# Patient Record
Sex: Female | Born: 1980 | Race: White | Hispanic: No | Marital: Single | Smoking: Former smoker
Health system: Southern US, Community
[De-identification: ages and names within clinical notes are randomized; demographics above are authoritative.]

## PROBLEM LIST (undated history)

## (undated) DIAGNOSIS — F419 Anxiety disorder, unspecified: Secondary | ICD-10-CM

## (undated) DIAGNOSIS — F1910 Other psychoactive substance abuse, uncomplicated: Secondary | ICD-10-CM

## (undated) DIAGNOSIS — F32A Depression, unspecified: Secondary | ICD-10-CM

## (undated) DIAGNOSIS — E119 Type 2 diabetes mellitus without complications: Secondary | ICD-10-CM

## (undated) DIAGNOSIS — F329 Major depressive disorder, single episode, unspecified: Secondary | ICD-10-CM

## (undated) DIAGNOSIS — E785 Hyperlipidemia, unspecified: Secondary | ICD-10-CM

## (undated) HISTORY — PX: TUBAL LIGATION: SHX77

---
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
## 2000-01-09 ENCOUNTER — Ambulatory Visit (HOSPITAL_COMMUNITY): Admission: RE | Admit: 2000-01-09 | Discharge: 2000-01-09 | Payer: Self-pay | Admitting: Obstetrics and Gynecology

## 2000-01-11 ENCOUNTER — Ambulatory Visit (HOSPITAL_COMMUNITY): Admission: RE | Admit: 2000-01-11 | Discharge: 2000-01-11 | Payer: Self-pay | Admitting: Obstetrics and Gynecology

## 2000-01-18 ENCOUNTER — Ambulatory Visit (HOSPITAL_COMMUNITY): Admission: RE | Admit: 2000-01-18 | Discharge: 2000-01-18 | Payer: Self-pay | Admitting: Obstetrics and Gynecology

## 2000-01-18 ENCOUNTER — Encounter: Payer: Self-pay | Admitting: Obstetrics and Gynecology

## 2000-03-27 ENCOUNTER — Inpatient Hospital Stay (HOSPITAL_COMMUNITY): Admission: AD | Admit: 2000-03-27 | Discharge: 2000-03-29 | Payer: Self-pay | Admitting: Obstetrics and Gynecology

## 2003-05-31 ENCOUNTER — Emergency Department (HOSPITAL_COMMUNITY): Admission: EM | Admit: 2003-05-31 | Discharge: 2003-05-31 | Payer: Self-pay | Admitting: Emergency Medicine

## 2003-12-16 ENCOUNTER — Other Ambulatory Visit: Admission: RE | Admit: 2003-12-16 | Discharge: 2003-12-16 | Payer: Self-pay | Admitting: Obstetrics and Gynecology

## 2004-04-19 ENCOUNTER — Ambulatory Visit (HOSPITAL_COMMUNITY): Admission: RE | Admit: 2004-04-19 | Discharge: 2004-04-19 | Payer: Self-pay | Admitting: Obstetrics and Gynecology

## 2004-07-10 ENCOUNTER — Inpatient Hospital Stay (HOSPITAL_COMMUNITY): Admission: AD | Admit: 2004-07-10 | Discharge: 2004-07-12 | Payer: Self-pay | Admitting: Obstetrics and Gynecology

## 2004-07-11 ENCOUNTER — Encounter (INDEPENDENT_AMBULATORY_CARE_PROVIDER_SITE_OTHER): Payer: Self-pay | Admitting: *Deleted

## 2004-12-30 ENCOUNTER — Emergency Department (HOSPITAL_COMMUNITY): Admission: EM | Admit: 2004-12-30 | Discharge: 2004-12-30 | Payer: Self-pay | Admitting: Emergency Medicine

## 2005-05-30 ENCOUNTER — Emergency Department (HOSPITAL_COMMUNITY): Admission: EM | Admit: 2005-05-30 | Discharge: 2005-05-31 | Payer: Self-pay | Admitting: Emergency Medicine

## 2005-06-14 ENCOUNTER — Encounter: Payer: Self-pay | Admitting: Emergency Medicine

## 2006-12-08 ENCOUNTER — Emergency Department (HOSPITAL_COMMUNITY): Admission: EM | Admit: 2006-12-08 | Discharge: 2006-12-08 | Payer: Self-pay | Admitting: Family Medicine

## 2007-03-01 ENCOUNTER — Ambulatory Visit (HOSPITAL_COMMUNITY): Admission: RE | Admit: 2007-03-01 | Discharge: 2007-03-01 | Payer: Self-pay | Admitting: Family Medicine

## 2008-11-13 ENCOUNTER — Emergency Department (HOSPITAL_COMMUNITY): Admission: EM | Admit: 2008-11-13 | Discharge: 2008-11-13 | Payer: Self-pay | Admitting: Emergency Medicine

## 2009-10-13 ENCOUNTER — Emergency Department (HOSPITAL_COMMUNITY): Admission: EM | Admit: 2009-10-13 | Discharge: 2009-10-13 | Payer: Self-pay | Admitting: Emergency Medicine

## 2010-04-19 ENCOUNTER — Emergency Department (HOSPITAL_COMMUNITY): Payer: Self-pay

## 2010-04-19 ENCOUNTER — Emergency Department (HOSPITAL_COMMUNITY)
Admission: EM | Admit: 2010-04-19 | Discharge: 2010-04-19 | Disposition: A | Discharge status: Home / Self Care | Payer: Self-pay | Attending: Emergency Medicine | Admitting: Emergency Medicine

## 2010-04-19 DIAGNOSIS — S335XXA Sprain of ligaments of lumbar spine, initial encounter: Secondary | ICD-10-CM | POA: Insufficient documentation

## 2010-04-19 DIAGNOSIS — X503XXA Overexertion from repetitive movements, initial encounter: Secondary | ICD-10-CM | POA: Insufficient documentation

## 2010-04-19 LAB — URINE MICROSCOPIC-ADD ON

## 2010-04-19 LAB — URINALYSIS, ROUTINE W REFLEX MICROSCOPIC
Glucose, UA: NEGATIVE mg/dL
Hgb urine dipstick: NEGATIVE
Ketones, ur: NEGATIVE mg/dL
Protein, ur: NEGATIVE mg/dL

## 2010-04-29 LAB — DIFFERENTIAL
Eosinophils Relative: 1 % (ref 0–5)
Lymphocytes Relative: 24 % (ref 12–46)
Lymphs Abs: 2.1 10*3/uL (ref 0.7–4.0)
Monocytes Absolute: 0.7 10*3/uL (ref 0.1–1.0)

## 2010-04-29 LAB — CBC
HCT: 33.8 % — ABNORMAL LOW (ref 36.0–46.0)
MCV: 86.7 fL (ref 78.0–100.0)
Platelets: 194 10*3/uL (ref 150–400)
RBC: 3.9 MIL/uL (ref 3.87–5.11)
WBC: 8.6 10*3/uL (ref 4.0–10.5)

## 2010-04-29 LAB — URINALYSIS, ROUTINE W REFLEX MICROSCOPIC
Bilirubin Urine: NEGATIVE
Nitrite: NEGATIVE
Specific Gravity, Urine: 1.015 (ref 1.005–1.030)
pH: 7.5 (ref 5.0–8.0)

## 2010-04-29 LAB — PREGNANCY, URINE: Preg Test, Ur: NEGATIVE

## 2010-04-29 LAB — BASIC METABOLIC PANEL
Chloride: 112 mEq/L (ref 96–112)
GFR calc Af Amer: >60 mL/min (ref >60)
Potassium: 3.1 mEq/L — ABNORMAL LOW (ref 3.5–5.1)

## 2010-04-29 LAB — URINE MICROSCOPIC-ADD ON

## 2010-07-01 NOTE — Discharge Summary (Signed)
NAME:  [name], [name]                 ACCOUNT NO.:  [id number]   MEDICAL RECORD NO.:  [id number]          PATIENT TYPE:  INP   LOCATION:  9107                          FACILITY:  [facility]   PHYSICIAN:  Huel Cote, M.D. DATE OF BIRTH:  July 16, 1980   DATE OF ADMISSION:  07/10/2004  DATE OF DISCHARGE:  07/12/2004                                 DISCHARGE SUMMARY   DISCHARGE DIAGNOSES:  1.  Term pregnancy at 56 6/7 weeks delivered.  2.  Status post vaginal birth after cesarean section.  3.  Status post postpartum tubal ligation.   DISCHARGE FOLLOWUP:  The patient's follow-up in the office in approximately  two weeks for an incision check.   HOSPITAL COURSE:  The patient is a 30 year old, G3, P2-0-0-1 who was  admitted at 12 6/7 weeks for delivery after spontaneous rupture of  membranes. The patient had no contractions on arrival and was noted to be  grossly ruptured. Prenatal care had been complicated by a history of a  previous C-section with subsequent VBAC delivery and Rh negative status for  which she received RhoGAM. She also desired a tubal ligation. Prenatal labs  were as follows:  B negative, antibody negative, RPR nonreactive, rubella  immune, hepatitis B surface antigen negative, HIV nonreactive, GC negative,  chlamydia negative, group B strep negative. One hour Glucola 108.   PAST OBSTETRICAL HISTORY:  In 04-Jul-1997, she had an 8 pound 15 ounce infant boy.  This child subsequently died in a car accident and in 04-Jul-2000 she had a vaginal  delivery after a C-section of that prior delivery of an 8 pound 7 ounce  infant.   PAST GYN HISTORY:  None.   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   PAST MEDICAL HISTORY:  None.   ALLERGIES:  None.   MEDICATIONS:  She takes Ambien occasionally.   PHYSICAL EXAMINATION:  On admission she was afebrile with stable vital  signs. Fetal heart rate was reactive. Cervix was 72 and a -2 station with  grossly ruptured membranes noted. She was placed on Pitocin and progressed  slowly throughout the day, eventually reaching complete dilation. She then  pushed well with a normal spontaneous vaginal delivery of a vigorous female  infant over a first-degree laceration. Apgar's were 9 and 9, weight was 8  pounds 5 ounces. Nuchal cord x1 was reduced over the head,  placenta  delivered spontaneously, first-degree laceration was repaired with 2-0  Vicryl for hemostasis. The patient requested a tubal ligation which was  scheduled  for the following day after risks and benefits were discussed. Her  hemoglobin went from 9.8 to 8.8. She underwent her postpartum tubal ligation  without difficulty and on postpartum day #2 was felt stable for discharge  home. She was discharged on Vicodin and Motrin for pain and will follow up  in the office in two weeks as stated.       KR/MEDQ  D:  08/03/2004  T:  08/03/2004  Job:  [id number]

## 2010-07-01 NOTE — Discharge Summary (Signed)
[facility]  Patient:    PALMYRA, ROGACKI                      MRN: [id number] Adm. Date:  [id number] Disc. Date: [id number] Attending:  Malon Kindle                           Discharge Summary  DISCHARGE DIAGNOSES:          1. Intrauterine pregnancy at 39+ weeks,                                  delivered.                               2. Vaginal birth after cesarean section.                               3. Status post low forceps vaginal delivery.  DISCHARGE MEDICATIONS:        1. Motrin 600 mg p.o. every six hours p.r.n.                               2. Percocet one to two tablets p.o. every four                                  hours p.r.n.  DISCHARGE FOLLOWUP:           The patient is to follow up in approximately six weeks for her routine postpartum exam.  HOSPITAL COURSE:              The patient is a 30 year old G2, P1 who was admitted for induction of labor for a planned VBAC delivery. The patients pregnancy was complicated by a previous C-section and that child was later killed in a car accident. Also, she had a placenta previa early in pregnancy; however, followup ultrasound revealed that this had resolved. Otherwise, the patient was Rh negative and received RhoGAM, and had no other significant complications.  PRENATAL LABORATORIES:        B negative, antibody negative. RPR nonreactive. Rubella immune. Hepatitis B surface antigen negative. HIV negative. GC negative. Chlamydia negative. GBS negative. Her one-hour GTT was abnormal at 160; however, a three-hour was within normal limits.  PAST MEDICAL HISTORY:         None.  PAST SURGICAL HISTORY:        The patient had laser surgery on her vocal cores in 1987 and 1989 and also C-section in 1999.  PAST OBSTETRICAL HISTORY:     In 1999, she had a low transverse cesarean section for arrest of descent of an 8-pound 15-ounce infant.  ALLERGIES:                    No known drug  allergies.  MEDICATIONS AT TIME OF ADMISSION:                    Prenatal vitamins.  PHYSICAL EXAMINATION:         On admission, the patient was afebrile and vital signs were stable. Fetal heart rate was reactive.  Cervical exam was 2 cm/50 effaced and a -2 station.  DELIVERY NOTE:  She had assisted rupture of membranes with clear fluid obtained and was begun on Pitocin. The patient progressed well with Pitocin induction, and reached complete dilation later in the day. She pushed for approximately two hours and became exhausted with the vertex presenting at OA and a +2 to +3 station. The patient had a low forceps delivery and was delivered of an 8-pound 7-ounce female infant. Apgars were 9 and 9. Placenta was intact. Scar was intact. She had bilateral vaginal and labial lacerations which were repaired with 3-0 Dexon. EBL was approximately 600 cc. There were two loops of nuchal cord noted at the delivery. The patient was then admitted for routine postpartum care and did well. She remained afebrile and had normal lochia. Her pain was well controlled with Motrin and Percocet. Therefore, on postpartum day #2 she was discharged to home with follow up and medications as previously stated. DD:  03/29/00 TD:  03/29/00 Job: [id number]

## 2010-07-01 NOTE — Op Note (Signed)
NAME:  [name] [name], [name] [name]                 ACCOUNT NO.:  [id number]   MEDICAL RECORD NO.:  [id number]          PATIENT TYPE:  INP   LOCATION:  9107                          FACILITY:  [facility]   PHYSICIAN:  Zenaida Niece, M.D.DATE OF BIRTH:  Dec 13, 1980   DATE OF PROCEDURE:  07/11/2004  DATE OF DISCHARGE:                                 OPERATIVE REPORT   PREOPERATIVE DIAGNOSES:  1.  Postpartum.  2.  Desires surgical sterility.   POSTOPERATIVE DIAGNOSES:  1.  Postpartum.  2.  Desires surgical sterility.   PROCEDURE:  Bilateral partial salpingectomy.   SURGEON:  Zenaida Niece, M.D.   ANESTHESIA:  General endotracheal tube.   ESTIMATED BLOOD LOSS:  Less than 50 mL.   FINDINGS:  The patient had normal anatomy.   COMPLICATIONS:  None.   SPECIMENS:  Portions of bilateral fallopian tubes.   PROCEDURE IN DETAIL:  The patient was taken to the operating room and placed  in the dorsal supine position.  General anesthesia was induced and the  abdomen was prepped and draped in the usual sterile fashion.  Infraumbilical skin was then infiltrated with 0.25% Marcaine and a 3 cm  horizontal incision was made.  This was carried down to the fascia, which was an old elevated and incised  with the scissors.  Peritoneal cavity was then entered bluntly.  She was  placed in Trendelenburg.  Both fallopian tubes were identified and traced to  their fimbriated ends.  A segment of the middle of each tube was grasped  with a Babcock clamp.  A knuckle of tube was ligated on each side with 0  plain gut suture.  This knuckle of tube was then removed sharply and sent as  a specimen.  On both sides, both tubal ostia were identified and the stumps  were hemostatic.  The fascia was closed with running  0 Vicryl.  The skin was closed with running subcuticular suture of 4-0  Vicryl, followed by a Band-Aid.  The patient tolerated the procedure well.  She was extubated in the operating room taken to recovery room  in stable  condition.  Counts were correct x2, and she received no antibiotics.      TDM/MEDQ  D:  07/11/2004  T:  07/11/2004  Job:  [id number]

## 2010-10-18 ENCOUNTER — Encounter: Payer: Self-pay | Admitting: Emergency Medicine

## 2010-10-18 ENCOUNTER — Emergency Department (HOSPITAL_COMMUNITY): Payer: Self-pay

## 2010-10-18 ENCOUNTER — Emergency Department (HOSPITAL_COMMUNITY)
Admission: EM | Admit: 2010-10-18 | Discharge: 2010-10-18 | Disposition: A | Discharge status: Home / Self Care | Payer: Self-pay | Attending: Emergency Medicine | Admitting: Emergency Medicine

## 2010-10-18 DIAGNOSIS — R000 Tachycardia, unspecified: Secondary | ICD-10-CM | POA: Insufficient documentation

## 2010-10-18 DIAGNOSIS — N739 Female pelvic inflammatory disease, unspecified: Secondary | ICD-10-CM | POA: Insufficient documentation

## 2010-10-18 DIAGNOSIS — Z87891 Personal history of nicotine dependence: Secondary | ICD-10-CM | POA: Insufficient documentation

## 2010-10-18 DIAGNOSIS — N72 Inflammatory disease of cervix uteri: Secondary | ICD-10-CM | POA: Insufficient documentation

## 2010-10-18 LAB — URINE MICROSCOPIC-ADD ON

## 2010-10-18 LAB — URINALYSIS, ROUTINE W REFLEX MICROSCOPIC
Bilirubin Urine: NEGATIVE
Glucose, UA: NEGATIVE mg/dL
Hgb urine dipstick: NEGATIVE
Ketones, ur: 40 mg/dL — AB
Nitrite: NEGATIVE
Protein, ur: 30 mg/dL — AB
Specific Gravity, Urine: >1.03 — ABNORMAL HIGH (ref 1.005–1.030)
Urobilinogen, UA: 1 mg/dL (ref 0.0–1.0)
pH: 6 (ref 5.0–8.0)

## 2010-10-18 LAB — CBC
HCT: 35.2 % — ABNORMAL LOW (ref 36.0–46.0)
Hemoglobin: 11.7 g/dL — ABNORMAL LOW (ref 12.0–15.0)
MCH: 27.3 pg (ref 26.0–34.0)
MCHC: 33.2 g/dL (ref 30.0–36.0)
MCV: 82.2 fL (ref 78.0–100.0)
Platelets: 195 10*3/uL (ref 150–400)
RBC: 4.28 MIL/uL (ref 3.87–5.11)
RDW: 14.3 % (ref 11.5–15.5)
WBC: 14.3 10*3/uL — ABNORMAL HIGH (ref 4.0–10.5)

## 2010-10-18 LAB — BASIC METABOLIC PANEL
BUN: 8 mg/dL (ref 6–23)
CO2: 23 mEq/L (ref 19–32)
Calcium: 9.3 mg/dL (ref 8.4–10.5)
Chloride: 98 mEq/L (ref 96–112)
Creatinine, Ser: 0.74 mg/dL (ref 0.50–1.10)
GFR calc Af Amer: >60 mL/min (ref >60)
GFR calc non Af Amer: >60 mL/min (ref >60)
Glucose, Bld: 98 mg/dL (ref 70–99)
Potassium: 3.4 mEq/L — ABNORMAL LOW (ref 3.5–5.1)
Sodium: 131 mEq/L — ABNORMAL LOW (ref 135–145)

## 2010-10-18 LAB — WET PREP, GENITAL
Trich, Wet Prep: NONE SEEN
Yeast Wet Prep HPF POC: NONE SEEN

## 2010-10-18 LAB — HCG, QUANTITATIVE, PREGNANCY: hCG, Beta Chain, Quant, S: 1 m[IU]/mL (ref <5)

## 2010-10-18 MED ORDER — OXYCODONE-ACETAMINOPHEN 5-325 MG PO TABS: 1.0000 | ORAL_TABLET | ORAL | Status: AC | PRN

## 2010-10-18 MED ORDER — SODIUM CHLORIDE 0.9 % IV SOLN
Freq: Once | INTRAVENOUS | Status: AC
  Administered 2010-10-18: 12:00:00 via INTRAVENOUS

## 2010-10-18 MED ORDER — HYDROMORPHONE HCL 1 MG/ML IJ SOLN
1.0000 mg | Freq: Once | INTRAMUSCULAR | Status: AC
  Administered 2010-10-18: 1 mg via INTRAVENOUS
  Filled 2010-10-18: qty 1

## 2010-10-18 MED ORDER — KETOROLAC TROMETHAMINE 30 MG/ML IJ SOLN
15.0000 mg | Freq: Once | INTRAMUSCULAR | Status: AC
  Administered 2010-10-18: 30 mg via INTRAVENOUS

## 2010-10-18 MED ORDER — KETOROLAC TROMETHAMINE 30 MG/ML IJ SOLN
INTRAMUSCULAR | Status: AC
  Filled 2010-10-18: qty 1

## 2010-10-18 MED ORDER — ONDANSETRON HCL 4 MG/2ML IJ SOLN
4.0000 mg | Freq: Once | INTRAMUSCULAR | Status: AC
  Administered 2010-10-18: 4 mg via INTRAVENOUS
  Filled 2010-10-18: qty 2

## 2010-10-18 MED ORDER — DOXYCYCLINE HYCLATE 100 MG PO CAPS: 100.0000 mg | ORAL_CAPSULE | Freq: Two times a day (BID) | ORAL | Status: AC

## 2010-10-18 MED ORDER — CEFTRIAXONE SODIUM 250 MG IJ SOLR
250.0000 mg | Freq: Once | INTRAMUSCULAR | Status: AC
  Administered 2010-10-18: 250 mg via INTRAMUSCULAR
  Filled 2010-10-18: qty 250

## 2010-10-18 MED ORDER — LIDOCAINE HCL (PF) 1 % IJ SOLN
INTRAMUSCULAR | Status: AC
  Filled 2010-10-18: qty 5

## 2010-10-18 NOTE — ED Notes (Signed)
Assisted with pelvic exam--cultures obtained and sent to lab.

## 2010-10-18 NOTE — ED Provider Notes (Addendum)
History   HPI Comments: 30yf with R lower back and R flank pain. Onset yesterday while standing at work. Constant with occasional sharper pain. No radiation. Subjective fever. Hx of kidney stones and says feels like previous. Decreased urination. No vaginal bleeding or discharge. Hx of tubal ligation. Nausea. Vomiting x1 yesterday. No sick contacts. No diarrhea. Denies trauma.   The history is provided by the patient. No language interpreter was used.    CSN: [id number] Arrival date & time: 10/18/2010 10:47 AM  ED Triage Vitals  Enc Vitals Group     BP 10/18/10 1044 132/76 mmHg     Pulse Rate 10/18/10 1044 111      Resp 10/18/10 1044 18      Temp 10/18/10 1044 100 F (37.8 C)     Temp src 10/18/10 1044 Oral     SpO2 10/18/10 1044 100 %     Weight 10/18/10 1044 168 lb (76.204 kg)     Height 10/18/10 1044 5\' 6"  (1.676 m)     Head Cir --      Peak Flow --      Pain Score 10/18/10 1044 Ten     Pain Loc --      Pain Edu? --      Excl. in GC? --      Chief Complaint  Patient presents with  . Abdominal Pain  . Back Pain  . Headache  . Sore Throat   HPI Comments: 30yf with R lower back and R flank pain. Onset yesterday while standing at work. Constant with occasional sharper pain. No radiation. Subjective fever. Hx of kidney stones and says feels like previous. Decreased urination. No vaginal bleeding or discharge. Hx of tubal ligation. Nausea. Vomiting x1 yesterday. No sick contacts. No diarrhea. Denies trauma.   The history is provided by the patient. No language interpreter was used.    History reviewed. No pertinent past medical history.  History reviewed. No pertinent past surgical history.  History reviewed. No pertinent family history.  History  Substance Use Topics  . Smoking status: Former Games developer  . Smokeless tobacco: Not on file  . Alcohol Use: No    OB History    Grav Para Term Preterm Abortions TAB SAB Ect Mult Living                  Review of Systems    Constitutional: Positive for fever.  Cardiovascular: Negative for chest pain and palpitations.  Gastrointestinal: Negative for abdominal distention.  Genitourinary: Positive for decreased urine volume and difficulty urinating. Negative for dysuria, vaginal bleeding and vaginal discharge.  Musculoskeletal: Positive for back pain.  Neurological: Negative for dizziness and syncope.    Physical Exam  BP 132/76  Pulse 111  Temp(Src) 100 F (37.8 C) (Oral)  Resp 18  Ht 5\' 6"  (1.676 m)  Wt 168 lb (76.204 kg)  BMI 27.12 kg/m2  SpO2 100%  LMP 09/14/2010  Physical Exam  Constitutional: She appears well-developed and well-nourished.  Neck: Trachea normal. No hepatojugular reflux present.  Cardiovascular: Regular rhythm.  Tachycardia present.        Tahcyhcardic. No murmur. Regular rhythm.  Pulmonary/Chest: No respiratory distress. She has no decreased breath sounds. She has no wheezes. She has no rhonchi. She has no rales.  Abdominal: Soft. She exhibits no distension. There is tenderness. There is guarding. There is no rebound.       Diffuse R sided abdominal tenderness and R CVA tenderness. Voluntary guarding. No rebound.  Genitourinary: Pelvic exam was performed with patient supine. Cervix exhibits discharge and friability. Right adnexum displays no mass, no tenderness and no fullness. Left adnexum displays no mass, no tenderness and no fullness.  Skin: Skin is warm. She is diaphoretic.  Psychiatric:       Anxious     ED Course  Procedures  Results for orders placed during the hospital encounter of 10/18/10  CBC      Component Value Range   WBC 14.3 (*) 4.0 - 10.5 (K/uL)   RBC 4.28  3.87 - 5.11 (MIL/uL)   Hemoglobin 11.7 (*) 12.0 - 15.0 (g/dL)   HCT 16.1 (*) 09.6 - 46.0 (%)   MCV 82.2  78.0 - 100.0 (fL)   MCH 27.3  26.0 - 34.0 (pg)   MCHC 33.2  30.0 - 36.0 (g/dL)   RDW 04.5  40.9 - 81.1 (%)   Platelets 195  150 - 400 (K/uL)  BASIC METABOLIC PANEL      Component Value  Range   Sodium 131 (*) 135 - 145 (mEq/L)   Potassium 3.4 (*) 3.5 - 5.1 (mEq/L)   Chloride 98  96 - 112 (mEq/L)   CO2 23  19 - 32 (mEq/L)   Glucose, Bld 98  70 - 99 (mg/dL)   BUN 8  6 - 23 (mg/dL)   Creatinine, Ser 9.14  0.50 - 1.10 (mg/dL)   Calcium 9.3  8.4 - 78.2 (mg/dL)   GFR calc non Af Amer >60  >60 (mL/min)   GFR calc Af Amer >60  >60 (mL/min)  HCG, QUANTITATIVE, PREGNANCY      Component Value Range   hCG, Beta Chain, Quant, S 1  <5 (mIU/mL)  URINALYSIS, ROUTINE W REFLEX MICROSCOPIC      Component Value Range   Color, Urine YELLOW  YELLOW    Appearance CLOUDY (*) CLEAR    Specific Gravity, Urine >1.030 (*) 1.005 - 1.030    pH 6.0  5.0 - 8.0    Glucose, UA NEGATIVE  NEGATIVE (mg/dL)   Hgb urine dipstick NEGATIVE  NEGATIVE    Bilirubin Urine NEGATIVE  NEGATIVE    Ketones, ur 40 (*) NEGATIVE (mg/dL)   Protein, ur 30 (*) NEGATIVE (mg/dL)   Urobilinogen, UA 1.0  0.0 - 1.0 (mg/dL)   Nitrite NEGATIVE  NEGATIVE    Leukocytes, UA LARGE (*) NEGATIVE   URINE MICROSCOPIC-ADD ON      Component Value Range   Squamous Epithelial / LPF MANY (*) RARE    WBC, UA 7-10  <3 (WBC/hpf)   RBC / HPF 0-2  <3 (RBC/hpf)   Bacteria, UA FEW (*) RARE    Ct Abdomen Pelvis Wo Contrast  10/18/2010  *RADIOLOGY REPORT*  Clinical Data: Abdominal pain, back pain.  History of kidney stones.  CT ABDOMEN AND PELVIS WITHOUT CONTRAST  Technique:  Multidetector CT imaging of the abdomen and pelvis was performed following the standard protocol without intravenous contrast.  Comparison: 10/13/2009  Findings: Lung bases are clear.  No effusions.  Heart is normal size.  Liver, gallbladder, spleen, pancreas, adrenals and kidneys unremarkable.  No renal or ureteral stones. The no hydronephrosis. Urinary bladder is unremarkable.  Uterus and adnexa have a grossly unremarkable unenhanced appearance.  Appendix is visualized and is normal.  Trace free fluid in the pelvis, likely physiologic.  Large and small bowel decompressed  and grossly unremarkable.  No acute bony abnormality. No acute bony abnormality.  There is a bulging disc at L4-5 and to a lesser  extent L5-S1.  IMPRESSION: No renal or ureteral stones.  No hydronephrosis.  No acute findings.  Original Report Authenticated By: Cyndie Chime, M.D.      Discussed CT and other results with pt. No large adnexal mass on CT which may suggest possible ovarian torsion.  Recommended pelvic exam to eval for PID/TOA although no suggestive inflammatory changes on CT. Pt agreeable.  MDM 30yF with abdominal pain. Pelvic exam showed cervicitis and a large amount of discharge. CT unremarkable. Pain improved and no new complaints prior to DC. Tx for PID and discussed signs/symptoms to return for immediate re-evaluation.      Raeford Razor, MD 10/22/10 2011  Raeford Razor, MD 10/24/10 [phone]

## 2010-10-18 NOTE — ED Notes (Signed)
C/o bilateral lower abdominal pain radiating into low back on both sides---onset yesterday with gradual worsening and similar to previous episodes with kidney stones.  Also c/o burning with urination--Rates pain 10 on 1-10 scale

## 2010-10-18 NOTE — ED Notes (Signed)
Pt c/o lower abd pain, back pain, headache, sore throat and states she is having a hard time trying to urinate.

## 2010-10-19 LAB — GC/CHLAMYDIA PROBE AMP, GENITAL
Chlamydia, DNA Probe: NEGATIVE
GC Probe Amp, Genital: NEGATIVE

## 2010-11-04 ENCOUNTER — Emergency Department (HOSPITAL_COMMUNITY)
Admission: EM | Admit: 2010-11-04 | Discharge: 2010-11-04 | Discharge status: Against Medical Advice | Payer: Self-pay | Attending: Emergency Medicine | Admitting: Emergency Medicine

## 2010-11-04 ENCOUNTER — Ambulatory Visit (HOSPITAL_COMMUNITY)
Admission: RE | Admit: 2010-11-04 | Discharge: 2010-11-04 | Disposition: A | Discharge status: Home / Self Care | Payer: Self-pay | Source: Ambulatory Visit | Attending: Psychiatry | Admitting: Psychiatry

## 2010-11-04 DIAGNOSIS — F1920 Other psychoactive substance dependence, uncomplicated: Secondary | ICD-10-CM | POA: Insufficient documentation

## 2010-11-23 LAB — POCT URINALYSIS DIP (DEVICE)
Glucose, UA: NEGATIVE
Nitrite: POSITIVE — AB
Operator id: 239701
Protein, ur: >=300 — AB
Specific Gravity, Urine: 1.025
Urobilinogen, UA: 0.2

## 2010-11-23 LAB — POCT PREGNANCY, URINE: Preg Test, Ur: NEGATIVE

## 2012-05-22 ENCOUNTER — Telehealth: Payer: Self-pay | Admitting: Nurse Practitioner

## 2012-05-23 NOTE — Telephone Encounter (Signed)
Still no call- will wait for pt to call back

## 2012-05-25 ENCOUNTER — Telehealth: Payer: Self-pay | Admitting: Physician Assistant

## 2012-05-25 MED ORDER — CLONAZEPAM 0.5 MG PO TABS: 0.5000 mg | ORAL_TABLET | Freq: Every evening | ORAL | Status: DC | PRN

## 2012-05-25 NOTE — Telephone Encounter (Addendum)
Dr. Modesto Charon authorized refill of clonazepam but not lorazepam stating that it was a duplication.  She needs to establish care with another PCP since Helene Kelp, PA-C is no longer working here.  Phoned into [facility] pharmacy and patient is aware.

## 2012-05-25 NOTE — Addendum Note (Signed)
Addended by: Gwenith Daily on: 05/25/2012 09:46 AM   Modules accepted: Orders

## 2012-05-25 NOTE — Telephone Encounter (Signed)
Refilled clonazepam once only, not the ativan. Patient needs to see a provider.

## 2012-05-25 NOTE — Addendum Note (Signed)
Addended by: Gwenith Daily on: 05/25/2012 10:00 AM   Modules accepted: Orders

## 2012-05-25 NOTE — Telephone Encounter (Signed)
Requesting refill on clonazepam and lorazepam.  Pharmacy has sent over multiple faxed requests last week.

## 2012-05-30 NOTE — Telephone Encounter (Signed)
LEFT A DETAILED MESSAGE THAT IF STILL NEEDS APPT TO PLEASE CALL

## 2012-06-01 ENCOUNTER — Emergency Department (HOSPITAL_COMMUNITY)
Admission: EM | Admit: 2012-06-01 | Discharge: 2012-06-01 | Disposition: A | Discharge status: Home / Self Care | Payer: Medicaid Other | Attending: Emergency Medicine | Admitting: Emergency Medicine

## 2012-06-01 ENCOUNTER — Encounter (HOSPITAL_COMMUNITY): Payer: Self-pay | Admitting: Emergency Medicine

## 2012-06-01 DIAGNOSIS — Z9851 Tubal ligation status: Secondary | ICD-10-CM | POA: Insufficient documentation

## 2012-06-01 DIAGNOSIS — F17200 Nicotine dependence, unspecified, uncomplicated: Secondary | ICD-10-CM | POA: Insufficient documentation

## 2012-06-01 DIAGNOSIS — E785 Hyperlipidemia, unspecified: Secondary | ICD-10-CM | POA: Insufficient documentation

## 2012-06-01 DIAGNOSIS — F3289 Other specified depressive episodes: Secondary | ICD-10-CM | POA: Insufficient documentation

## 2012-06-01 DIAGNOSIS — Z79899 Other long term (current) drug therapy: Secondary | ICD-10-CM | POA: Insufficient documentation

## 2012-06-01 DIAGNOSIS — R112 Nausea with vomiting, unspecified: Secondary | ICD-10-CM | POA: Insufficient documentation

## 2012-06-01 DIAGNOSIS — R42 Dizziness and giddiness: Secondary | ICD-10-CM | POA: Insufficient documentation

## 2012-06-01 DIAGNOSIS — F329 Major depressive disorder, single episode, unspecified: Secondary | ICD-10-CM | POA: Insufficient documentation

## 2012-06-01 DIAGNOSIS — R630 Anorexia: Secondary | ICD-10-CM | POA: Insufficient documentation

## 2012-06-01 DIAGNOSIS — R109 Unspecified abdominal pain: Secondary | ICD-10-CM

## 2012-06-01 DIAGNOSIS — F411 Generalized anxiety disorder: Secondary | ICD-10-CM | POA: Insufficient documentation

## 2012-06-01 HISTORY — DX: Depression, unspecified: F32.A

## 2012-06-01 HISTORY — DX: Hyperlipidemia, unspecified: E78.5

## 2012-06-01 HISTORY — DX: Anxiety disorder, unspecified: F41.9

## 2012-06-01 HISTORY — DX: Major depressive disorder, single episode, unspecified: F32.9

## 2012-06-01 LAB — POCT I-STAT, CHEM 8
Creatinine, Ser: 0.7 mg/dL (ref 0.50–1.10)
Glucose, Bld: 86 mg/dL (ref 70–99)
HCT: 37 % (ref 36.0–46.0)
Hemoglobin: 12.6 g/dL (ref 12.0–15.0)
Potassium: 4.1 mEq/L (ref 3.5–5.1)
TCO2: 27 mmol/L (ref 0–100)

## 2012-06-01 MED ORDER — SODIUM CHLORIDE 0.9 % IV SOLN
1000.0000 mL | Freq: Once | INTRAVENOUS | Status: AC
  Administered 2012-06-01: 1000 mL via INTRAVENOUS

## 2012-06-01 MED ORDER — ONDANSETRON HCL 4 MG/2ML IJ SOLN
4.0000 mg | Freq: Once | INTRAMUSCULAR | Status: AC
  Administered 2012-06-01: 4 mg via INTRAVENOUS
  Filled 2012-06-01: qty 2

## 2012-06-01 MED ORDER — ONDANSETRON HCL 4 MG PO TABS: 4.0000 mg | ORAL_TABLET | Freq: Four times a day (QID) | ORAL | Status: DC

## 2012-06-01 MED ORDER — METOCLOPRAMIDE HCL 5 MG/ML IJ SOLN
10.0000 mg | Freq: Once | INTRAMUSCULAR | Status: AC
  Administered 2012-06-01: 10 mg via INTRAVENOUS
  Filled 2012-06-01: qty 2

## 2012-06-01 MED ORDER — SODIUM CHLORIDE 0.9 % IV SOLN: 1000.0000 mL | INTRAVENOUS | Status: DC

## 2012-06-01 MED ORDER — PROMETHAZINE HCL 25 MG/ML IJ SOLN
12.5000 mg | Freq: Once | INTRAMUSCULAR | Status: AC
  Administered 2012-06-01: 21:00:00 via INTRAVENOUS
  Filled 2012-06-01: qty 1

## 2012-06-01 MED ORDER — DIPHENHYDRAMINE HCL 50 MG/ML IJ SOLN
25.0000 mg | Freq: Once | INTRAMUSCULAR | Status: AC
  Administered 2012-06-01: 25 mg via INTRAVENOUS
  Filled 2012-06-01: qty 1

## 2012-06-01 MED ORDER — KETOROLAC TROMETHAMINE 30 MG/ML IJ SOLN
30.0000 mg | Freq: Once | INTRAMUSCULAR | Status: AC
  Administered 2012-06-01: 30 mg via INTRAVENOUS
  Filled 2012-06-01: qty 1

## 2012-06-01 MED ORDER — PROMETHAZINE HCL 25 MG RE SUPP: 25.0000 mg | Freq: Four times a day (QID) | RECTAL | Status: DC | PRN

## 2012-06-01 NOTE — ED Provider Notes (Signed)
History  This chart was scribed for Ward Givens, MD by Erskine Emery, ED Scribe. This patient was seen in room APA15/APA15 and the patient's care was started at 19:20.   CSN: [id number]  Arrival date & time 06/01/12  1903   First MD Initiated Contact with Patient 06/01/12 1920      Chief Complaint  Patient presents with  . Abdominal Pain  . Emesis    (Consider location/radiation/quality/duration/timing/severity/associated sxs/prior treatment) The history is provided by the patient. No language interpreter was used.  [name] [name] is a 32 y.o. female who presents to the Emergency Department complaining of sudden onset emesis (with streaks of blood the past two episodes) and lower abdominal cramping since 1pm this afternoon, just after lunch (which she didn't have an appetite to each much of). She has vomited about 10 times but denies diarrhea.  Pt reports some associated nausea, dizziness, and light headedness, but denies any fever or known sick contacts. Pt states she feels like she is going to pass out when she stands up. No one else is sick at home, she has no known food exposure to make her ill.   Pt was recently diagnosed with H. pylori and was taking antibiotics for it, but she has been off of them for about 2 months.   Pt also presents with a c-section scar that she has had for 15 years that she claims sometimes gets red and irritated. Pt's husband claims there are "holes" at the site of her scar that get infected.  PCP is Dr. Helene Kelp at [facility].  Past Medical History  Diagnosis Date  . Hyperlipidemia   . Depression   . Anxiety     Past Surgical History  Procedure Laterality Date  . Cesarean section    . Tubal ligation      History reviewed. No pertinent family history.  History  Substance Use Topics  . Smoking status: Current Every Day Smoker  . Smokeless tobacco: Not on file  . Alcohol Use: No   Pt reports she smokes about 1  pack/month, not every day. Lives with spouse  OB History   Grav Para Term Preterm Abortions TAB SAB Ect Mult Living                  Review of Systems  Constitutional: Positive for appetite change. Negative for fever.  Gastrointestinal: Positive for nausea, vomiting and abdominal pain. Negative for diarrhea.  Neurological: Positive for dizziness and light-headedness.  All other systems reviewed and are negative.    Allergies  Codeine and Vicodin  Home Medications   Current Outpatient Rx  Name  Route  Sig  Dispense  Refill  . clonazePAM (KLONOPIN) 0.5 MG tablet   Oral   Take 1 tablet (0.5 mg total) by mouth at bedtime as needed for anxiety.   30 tablet   0   . ibuprofen (ADVIL,MOTRIN) 200 MG tablet   Oral   Take 600-800 mg by mouth every 6 (six) hours as needed. Pain          . LORazepam (ATIVAN) 0.5 MG tablet   Oral   Take 0.5 mg by mouth every 12 (twelve) hours. Take every 12-24 hours         . omeprazole (PRILOSEC) 20 MG capsule   Oral   Take 20 mg by mouth 2 (two) times daily.         . sertraline (ZOLOFT) 50 MG tablet   Oral  Take 50 mg by mouth daily.           Triage Vitals: BP 127/77  Pulse 67  Temp(Src) 97 F (36.1 C) (Oral)  Resp 18  Ht 5\' 6"  (1.676 m)  Wt 186 lb (84.369 kg)  BMI 30.04 kg/m2  SpO2 100%  LMP 05/01/2012  Vital signs normal    Physical Exam  Nursing note and vitals reviewed. Constitutional: She is oriented to person, place, and time. She appears well-developed and well-nourished.  Non-toxic appearance. She does not appear ill. No distress.  HENT:  Head: Normocephalic and atraumatic.  Right Ear: External ear normal.  Left Ear: External ear normal.  Nose: Nose normal. No mucosal edema or rhinorrhea.  Mouth/Throat: Oropharynx is clear and moist and mucous membranes are normal. No dental abscesses or edematous.  Mucus membranes moist.  Eyes: Conjunctivae and EOM are normal. Pupils are equal, round, and reactive to  light.  Neck: Normal range of motion and full passive range of motion without pain. Neck supple.  Cardiovascular: Normal rate, regular rhythm and normal heart sounds.  Exam reveals no gallop and no friction rub.   No murmur heard. Pulmonary/Chest: Effort normal and breath sounds normal. No respiratory distress. She has no wheezes. She has no rhonchi. She has no rales. She exhibits no tenderness and no crepitus.  Abdominal: Soft. Normal appearance and bowel sounds are normal. She exhibits no distension. There is tenderness. There is no rebound and no guarding.  Tender diffusely in lower abdomen.  Musculoskeletal: Normal range of motion. She exhibits no edema and no tenderness.  Moves all extremities well.   Neurological: She is alert and oriented to person, place, and time. She has normal strength. No cranial nerve deficit.  Skin: Skin is warm, dry and intact. No rash noted. No erythema. No pallor.  Well-healed bikini c-section scar, with no "holes"  Psychiatric: Her speech is normal and behavior is normal. Her mood appears not anxious.  Seems anxious.    ED Course  Procedures (including critical care time)  Medications  0.9 %  sodium chloride infusion (0 mLs Intravenous Stopped 06/01/12 2057)    Followed by  0.9 %  sodium chloride infusion (0 mLs Intravenous Stopped 06/01/12 2223)    Followed by  0.9 %  sodium chloride infusion (not administered)  metoCLOPramide (REGLAN) injection 10 mg (10 mg Intravenous Given 06/01/12 2014)  ondansetron (ZOFRAN) injection 4 mg (4 mg Intravenous Given 06/01/12 2017)  diphenhydrAMINE (BENADRYL) injection 25 mg (25 mg Intravenous Given 06/01/12 2019)  promethazine (PHENERGAN) injection 12.5 mg ( Intravenous Given 06/01/12 2054)  ketorolac (TORADOL) 30 MG/ML injection 30 mg (30 mg Intravenous Given 06/01/12 2229)    DIAGNOSTIC STUDIES: Oxygen Saturation is 100% on room air, normal by my interpretation.    COORDINATION OF CARE: 19:42--I evaluated the  patient and we discussed a treatment plan including IV fluids and nausea medication to which the pt agreed. We discussed yeast infection treatment for the area around her c-section scar.  Pt feeling better and ready to go home.   Results for orders placed during the hospital encounter of 06/01/12  POCT I-STAT, CHEM 8      Result Value Range   Sodium 139  135 - 145 mEq/L   Potassium 4.1  3.5 - 5.1 mEq/L   Chloride 105  96 - 112 mEq/L   BUN 15  6 - 23 mg/dL   Creatinine, Ser 1.61  0.50 - 1.10 mg/dL   Glucose, Bld 86  70 -  99 mg/dL   Calcium, Ion 1.61  1.12 - 1.23 mmol/L   TCO2 27  0 - 100 mmol/L   Hemoglobin 12.6  12.0 - 15.0 g/dL   HCT 09.6  04.5 - 40.9 %   Laboratory interpretation all normal    1. Nausea and vomiting in adult   2. Abdominal cramping      New Prescriptions   ONDANSETRON (ZOFRAN) 4 MG TABLET    Take 1 tablet (4 mg total) by mouth every 6 (six) hours.   PROMETHAZINE (PHENERGAN) 25 MG SUPPOSITORY    Place 1 suppository (25 mg total) rectally every 6 (six) hours as needed for nausea.    Plan discharge  Devoria Albe, MD, FACEP    MDM   I personally performed the services described in this documentation, which was scribed in my presence. The recorded information has been reviewed and considered.  Devoria Albe, MD, Armando Gang    Ward Givens, MD 06/01/12 [phone]

## 2012-06-01 NOTE — ED Notes (Signed)
Patient complaining of nausea, emesis, dizziness, and abdominal cramps that started today.

## 2012-06-26 ENCOUNTER — Other Ambulatory Visit: Payer: Self-pay

## 2012-06-26 MED ORDER — CLONAZEPAM 0.5 MG PO TABS: 0.5000 mg | ORAL_TABLET | Freq: Every evening | ORAL | Status: DC | PRN

## 2012-06-26 NOTE — Telephone Encounter (Signed)
Please call in rx for clonazepam 

## 2012-06-26 NOTE — Telephone Encounter (Signed)
Med at [facility]

## 2012-06-26 NOTE — Telephone Encounter (Signed)
Last filled 05/25/12   Last seen 04/18/12   Phone in and have nurse call patient to let them know

## 2012-07-17 ENCOUNTER — Encounter: Payer: Self-pay | Admitting: Family Medicine

## 2012-07-17 ENCOUNTER — Ambulatory Visit (INDEPENDENT_AMBULATORY_CARE_PROVIDER_SITE_OTHER): Payer: Medicaid Other | Admitting: Family Medicine

## 2012-07-17 VITALS — BP 125/84 | HR 73 | Temp 97.2°F | Ht 67.0 in | Wt 181.0 lb

## 2012-07-17 DIAGNOSIS — F410 Panic disorder [episodic paroxysmal anxiety] without agoraphobia: Secondary | ICD-10-CM

## 2012-07-17 DIAGNOSIS — R768 Other specified abnormal immunological findings in serum: Secondary | ICD-10-CM

## 2012-07-17 DIAGNOSIS — F411 Generalized anxiety disorder: Secondary | ICD-10-CM

## 2012-07-17 DIAGNOSIS — R894 Abnormal immunological findings in specimens from other organs, systems and tissues: Secondary | ICD-10-CM

## 2012-07-17 DIAGNOSIS — E785 Hyperlipidemia, unspecified: Secondary | ICD-10-CM

## 2012-07-17 MED ORDER — SERTRALINE HCL 100 MG PO TABS: 100.0000 mg | ORAL_TABLET | Freq: Every day | ORAL | Status: DC

## 2012-07-17 MED ORDER — CLONAZEPAM 0.5 MG PO TABS: 0.5000 mg | ORAL_TABLET | Freq: Every evening | ORAL | Status: DC | PRN

## 2012-07-17 NOTE — Progress Notes (Signed)
Patient ID: [name], female   DOB: [birth year], 32 y.o.   MRN: [id number] SUBJECTIVE: Chief Complaint  Patient presents with  . Follow-up    WAS acm PT  . Panic Attack    ANXIETY   HPI: Boyfriend and father of her children is in jal for DUI. She also was in trouble for being in the vehicle. So te stresses of being a single parent , living at home with her parents and meeting with a parole officer on a regular basis makes her anxious and panic. H/o H pylori infection and teh blood work is always positive.  PMH/PSH: reviewed/updated in Epic  SH/FH: reviewed/updated in Epic  Allergies: reviewed/updated in Epic  Medications: reviewed/updated in Epic  Immunizations: reviewed/updated in Epic  ROS: As above in the HPI. All other systems are stable or negative.  OBJECTIVE: APPEARANCE:  Patient in no acute distress.The patient appeared well nourished and normally developed. Acyanotic. Waist: VITAL SIGNS:BP 125/84  Pulse 73  Temp(Src) 97.2 F (36.2 C) (Oral)  Ht 5\' 7"  (1.702 m)  Wt 181 lb (82.101 kg)  BMI 28.34 kg/m2  LMP 06/26/2012 WF, overweight.  SKIN: warm and  Dry without overt rashes, tattoos and scars  HEAD and Neck: without JVD, Head and scalp: normal Eyes:No scleral icterus. Fundi normal, eye movements normal. Ears: Auricle normal, canal normal, Tympanic membranes normal, insufflation normal. Nose: normal Throat: normal Neck & thyroid: normal  CHEST & LUNGS: Chest wall: normal Lungs: Clear  CVS: Reveals the PMI to be normally located. Regular rhythm, First and Second Heart sounds are normal,  absence of murmurs, rubs or gallops. Peripheral vasculature: Radial pulses: normal Dorsal pedis pulses: normal Posterior pulses: normal  ABDOMEN:  Appearance: normal Benign, no organomegaly, no masses, no Abdominal Aortic enlargement. No Guarding , no rebound. No Bruits. Bowel sounds: normal  RECTAL: N/A GU: N/A  EXTREMETIES: nonedematous. Both Femoral  and Pedal pulses are normal.  MUSCULOSKELETAL:  Spine: normal Joints: intact  NEUROLOGIC: oriented to time,place and person; nonfocal.  Psychiatry: stressed but composed and coping.  ASSESSMENT: Anxiety state, unspecified - Plan: sertraline (ZOLOFT) 100 MG tablet, clonazePAM (KLONOPIN) 0.5 MG tablet  Panic - Plan: sertraline (ZOLOFT) 100 MG tablet, clonazePAM (KLONOPIN) 0.5 MG tablet  HLD (hyperlipidemia) - Plan: NMR Lipoprofile with Lipids  Helicobacter pylori ab+ - Plan: Helicobacter pylori antigen det, stool  PLAN: Explained to patient that her blood work will continue to remain positive and that the test to do was the stools. Orders Placed This Encounter  Procedures  . NMR Lipoprofile with Lipids  . Helicobacter pylori antigen det, stool   Meds ordered this encounter  Medications  . sertraline (ZOLOFT) 100 MG tablet    Sig: Take 1 tablet (100 mg total) by mouth daily.    Dispense:  30 tablet    Refill:  3  . clonazePAM (KLONOPIN) 0.5 MG tablet    Sig: Take 1 tablet (0.5 mg total) by mouth at bedtime as needed for anxiety.    Dispense:  20 tablet    Refill:  0   Counseled patient on the need to self refer to [facility] for counselling and psychiatric care.  Limited benzodiazepine given to use prn for panic.  Return in about 3 months (around 10/17/2012) for Recheck medical problems.  Alan Drummer P. [place] Charon, M.D.

## 2012-07-18 LAB — NMR LIPOPROFILE WITH LIPIDS
Cholesterol, Total: 202 mg/dL — ABNORMAL HIGH (ref <200)
HDL Particle Number: 39.4 umol/L (ref >=30.5)
HDL Size: 9.4 nm (ref >=9.2)
HDL-C: 66 mg/dL (ref >=40)
LDL (calc): 116 mg/dL — ABNORMAL HIGH (ref <100)
LDL Particle Number: 1258 nmol/L — ABNORMAL HIGH (ref <1000)
LDL Size: 21.1 nm (ref >20.5)
LP-IR Score: 43 (ref <=45)
Large HDL-P: 8.9 umol/L (ref >=4.8)
Large VLDL-P: 5.5 nmol/L — ABNORMAL HIGH (ref <=2.7)
Small LDL Particle Number: 493 nmol/L (ref <=527)
Triglycerides: 99 mg/dL (ref <150)
VLDL Size: 47.8 nm — ABNORMAL HIGH (ref <=46.6)

## 2012-07-22 ENCOUNTER — Telehealth: Payer: Self-pay | Admitting: Family Medicine

## 2012-07-24 ENCOUNTER — Telehealth: Payer: Self-pay | Admitting: Family Medicine

## 2012-07-24 DIAGNOSIS — B002 Herpesviral gingivostomatitis and pharyngotonsillitis: Secondary | ICD-10-CM | POA: Insufficient documentation

## 2012-07-24 MED ORDER — VALACYCLOVIR HCL 1 G PO TABS: 2000.0000 mg | ORAL_TABLET | Freq: Two times a day (BID) | ORAL | Status: DC

## 2012-07-24 NOTE — Telephone Encounter (Signed)
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 Rx ordered in Epic. Sahirah Rudell P. [place] Charon, M.D.

## 2012-08-01 NOTE — Progress Notes (Signed)
Quick Note:  Lab result close to goal. No change in Medications for now. No Change in plans and follow up. Recommend that she embark on more aggressive diet and Exercise to reduce the cholesterol a bit more to get to goal of <100 LDLc. Thanks. Karl Knarr P. [place] Charon, M.D.  ______

## 2012-09-01 ENCOUNTER — Encounter (HOSPITAL_COMMUNITY): Payer: Self-pay | Admitting: *Deleted

## 2012-09-01 ENCOUNTER — Emergency Department (HOSPITAL_COMMUNITY)
Admission: EM | Admit: 2012-09-01 | Discharge: 2012-09-01 | Disposition: A | Discharge status: Home / Self Care | Payer: Medicaid Other | Attending: Emergency Medicine | Admitting: Emergency Medicine

## 2012-09-01 ENCOUNTER — Emergency Department (HOSPITAL_COMMUNITY): Payer: Medicaid Other

## 2012-09-01 DIAGNOSIS — Z79899 Other long term (current) drug therapy: Secondary | ICD-10-CM | POA: Insufficient documentation

## 2012-09-01 DIAGNOSIS — E785 Hyperlipidemia, unspecified: Secondary | ICD-10-CM | POA: Insufficient documentation

## 2012-09-01 DIAGNOSIS — W19XXXA Unspecified fall, initial encounter: Secondary | ICD-10-CM

## 2012-09-01 DIAGNOSIS — F329 Major depressive disorder, single episode, unspecified: Secondary | ICD-10-CM | POA: Insufficient documentation

## 2012-09-01 DIAGNOSIS — F17200 Nicotine dependence, unspecified, uncomplicated: Secondary | ICD-10-CM | POA: Insufficient documentation

## 2012-09-01 DIAGNOSIS — M549 Dorsalgia, unspecified: Secondary | ICD-10-CM

## 2012-09-01 DIAGNOSIS — Y9289 Other specified places as the place of occurrence of the external cause: Secondary | ICD-10-CM | POA: Insufficient documentation

## 2012-09-01 DIAGNOSIS — F3289 Other specified depressive episodes: Secondary | ICD-10-CM | POA: Insufficient documentation

## 2012-09-01 DIAGNOSIS — F411 Generalized anxiety disorder: Secondary | ICD-10-CM | POA: Insufficient documentation

## 2012-09-01 DIAGNOSIS — R209 Unspecified disturbances of skin sensation: Secondary | ICD-10-CM | POA: Insufficient documentation

## 2012-09-01 DIAGNOSIS — IMO0002 Reserved for concepts with insufficient information to code with codable children: Secondary | ICD-10-CM | POA: Insufficient documentation

## 2012-09-01 DIAGNOSIS — Y9389 Activity, other specified: Secondary | ICD-10-CM | POA: Insufficient documentation

## 2012-09-01 DIAGNOSIS — W1809XA Striking against other object with subsequent fall, initial encounter: Secondary | ICD-10-CM | POA: Insufficient documentation

## 2012-09-01 DIAGNOSIS — W010XXA Fall on same level from slipping, tripping and stumbling without subsequent striking against object, initial encounter: Secondary | ICD-10-CM | POA: Insufficient documentation

## 2012-09-01 MED ORDER — OXYCODONE-ACETAMINOPHEN 5-325 MG PO TABS: 1.0000 | ORAL_TABLET | ORAL | Status: DC | PRN

## 2012-09-01 MED ORDER — OXYCODONE-ACETAMINOPHEN 5-325 MG PO TABS
1.0000 | ORAL_TABLET | Freq: Once | ORAL | Status: AC
  Administered 2012-09-01: 1 via ORAL
  Filled 2012-09-01: qty 1

## 2012-09-01 NOTE — ED Notes (Signed)
Pt states fell today on cement. Lower back hurts.

## 2012-09-01 NOTE — ED Provider Notes (Signed)
History  This chart was scribed for Sharilyn Sites, PA-C working with Candyce Churn, MD by Greggory Stallion, ED scribe. This patient was seen in room TR08C/TR08C and the patient's care was started at 8:52 PM.  CSN: [id number] Arrival date & time 09/01/12  2037   Chief Complaint  Patient presents with  . Fall   The history is provided by the patient. No language interpreter was used.    HPI Comments: [name] [name] is a 32 y.o. female who presents to the Emergency Department complaining of sudden onset, constant lower back pain that started earlier today when she fell on cement. She states she tripped over her own feet and fell backwards, hitting her low back on the cement step.  No head trauma or LOC.  Some intermittent radiation of pain into her right posterior thigh.Denies any numbness or paresthesias of LE.  No loss of bowel or bladder function. Pt denies any other associated symptoms. She states she has taken ibuprofen with no relief. Ambulatory in triage.  Past Medical History  Diagnosis Date  . Hyperlipidemia   . Depression   . Anxiety    Past Surgical History  Procedure Laterality Date  . Cesarean section    . Tubal ligation     Family History  Problem Relation Age of Onset  . Hypertension Father   . Cancer Paternal Aunt     BREAST  . COPD Paternal Grandfather   . Hyperlipidemia Paternal Grandfather   . Hypertension Paternal Grandfather    History  Substance Use Topics  . Smoking status: Current Some Day Smoker -- 0.25 packs/day    Types: Cigarettes  . Smokeless tobacco: Not on file  . Alcohol Use: No   OB History   Grav Para Term Preterm Abortions TAB SAB Ect Mult Living                 Review of Systems  Musculoskeletal: Positive for back pain.  Neurological: Positive for numbness.  All other systems reviewed and are negative.    Allergies  Review of patient's allergies indicates no known allergies.  Home Medications   Current Outpatient Rx  Name   Route  Sig  Dispense  Refill  . clonazePAM (KLONOPIN) 0.5 MG tablet   Oral   Take 1 tablet (0.5 mg total) by mouth at bedtime as needed for anxiety.   20 tablet   0   . omeprazole (PRILOSEC) 20 MG capsule   Oral   Take 20 mg by mouth 2 (two) times daily.         . pravastatin (PRAVACHOL) 20 MG tablet   Oral   Take 20 mg by mouth daily.         . sertraline (ZOLOFT) 100 MG tablet   Oral   Take 1 tablet (100 mg total) by mouth daily.   30 tablet   3   . valACYclovir (VALTREX) 1000 MG tablet   Oral   Take 2 tablets (2,000 mg total) by mouth 2 (two) times daily. 2 doses only   30 tablet   0    BP 148/99  Pulse 74  Temp(Src) 98.2 F (36.8 C) (Oral)  Resp 16  SpO2 99%  Physical Exam  Nursing note and vitals reviewed. Constitutional: She is oriented to person, place, and time. She appears well-developed and well-nourished.  HENT:  Head: Normocephalic and atraumatic.  Eyes: Conjunctivae and EOM are normal. Pupils are equal, round, and reactive to light.  Neck: Normal range  of motion. Neck supple.  Cardiovascular: Normal rate, regular rhythm and normal heart sounds.   Pulmonary/Chest: Effort normal and breath sounds normal.  Musculoskeletal: Normal range of motion.       Lumbar back: She exhibits tenderness, bony tenderness and pain. She exhibits no edema and no deformity.       Back:  Diffuse tenderness to palpation of lumbar spine, several abrasions noted without signs of infection or FB, strong distal pulse, sensation intact, normal gait  Neurological: She is alert and oriented to person, place, and time.  Skin: Skin is warm and dry.  Psychiatric: She has a normal mood and affect.    ED Course  Procedures (including critical care time)  DIAGNOSTIC STUDIES: Oxygen Saturation is 99% on RA, normal by my interpretation.    COORDINATION OF CARE: 9:01 PM-Discussed treatment plan which includes pain medication and xray with pt at bedside and pt agreed to plan.    Labs Reviewed - No data to display Dg Lumbar Spine Complete  09/01/2012   *RADIOLOGY REPORT*  Clinical Data: Larey Seat on cement steps yesterday.  Mid low back pain. Numbness and tingling down the posterior legs.  LUMBAR SPINE - COMPLETE 4+ VIEW  Comparison: CT, 10/18/2010  Findings: No fracture or spondylolisthesis.  There is moderate loss of disc height at L4-L5 and L5-S1.  The remaining disc spaces well preserved.  The facet joints well preserved.  The surrounding soft tissues are unremarkable.  IMPRESSION: No fracture or acute finding.   Original Report Authenticated By: Amie Portland, M.D.   1. Fall, initial encounter   2. Back pain     MDM   X-ray negative for acute fracture or disc herniation. No concern for cauda equina. Rx Percocet. Followup with primary care physician if symptoms not improving in the next few days. Discussed plan with patient, she agreed. Return precautions advised.  I personally performed the services described in this documentation, which was scribed in my presence. The recorded information has been reviewed and is accurate.   Garlon Hatchet, PA-C 09/01/12 2210  Garlon Hatchet, PA-C 09/01/12 2214

## 2012-09-04 NOTE — ED Provider Notes (Signed)
Medical screening examination/treatment/procedure(s) were performed by non-physician practitioner and as supervising physician I was immediately available for consultation/collaboration.  Candyce Churn, MD 09/04/12 [phone]

## 2012-10-02 ENCOUNTER — Other Ambulatory Visit: Payer: Self-pay | Admitting: Anesthesiology

## 2012-10-02 DIAGNOSIS — M5416 Radiculopathy, lumbar region: Secondary | ICD-10-CM

## 2012-10-03 ENCOUNTER — Other Ambulatory Visit: Payer: Medicaid Other

## 2012-10-09 ENCOUNTER — Ambulatory Visit
Admission: RE | Admit: 2012-10-09 | Discharge: 2012-10-09 | Disposition: A | Discharge status: Home / Self Care | Payer: Medicaid Other | Source: Ambulatory Visit | Attending: Anesthesiology | Admitting: Anesthesiology

## 2012-10-09 DIAGNOSIS — M5416 Radiculopathy, lumbar region: Secondary | ICD-10-CM

## 2012-10-27 ENCOUNTER — Emergency Department (HOSPITAL_COMMUNITY): Payer: Medicaid Other

## 2012-10-27 ENCOUNTER — Encounter (HOSPITAL_COMMUNITY): Payer: Self-pay | Admitting: *Deleted

## 2012-10-27 ENCOUNTER — Emergency Department (HOSPITAL_COMMUNITY)
Admission: EM | Admit: 2012-10-27 | Discharge: 2012-10-27 | Disposition: A | Discharge status: Home / Self Care | Payer: Medicaid Other | Attending: Emergency Medicine | Admitting: Emergency Medicine

## 2012-10-27 DIAGNOSIS — N760 Acute vaginitis: Secondary | ICD-10-CM | POA: Insufficient documentation

## 2012-10-27 DIAGNOSIS — Z862 Personal history of diseases of the blood and blood-forming organs and certain disorders involving the immune mechanism: Secondary | ICD-10-CM | POA: Insufficient documentation

## 2012-10-27 DIAGNOSIS — F411 Generalized anxiety disorder: Secondary | ICD-10-CM | POA: Insufficient documentation

## 2012-10-27 DIAGNOSIS — Z79899 Other long term (current) drug therapy: Secondary | ICD-10-CM | POA: Insufficient documentation

## 2012-10-27 DIAGNOSIS — Z3202 Encounter for pregnancy test, result negative: Secondary | ICD-10-CM | POA: Insufficient documentation

## 2012-10-27 DIAGNOSIS — F17200 Nicotine dependence, unspecified, uncomplicated: Secondary | ICD-10-CM | POA: Insufficient documentation

## 2012-10-27 DIAGNOSIS — F3289 Other specified depressive episodes: Secondary | ICD-10-CM | POA: Insufficient documentation

## 2012-10-27 DIAGNOSIS — A499 Bacterial infection, unspecified: Secondary | ICD-10-CM | POA: Insufficient documentation

## 2012-10-27 DIAGNOSIS — N949 Unspecified condition associated with female genital organs and menstrual cycle: Secondary | ICD-10-CM | POA: Insufficient documentation

## 2012-10-27 DIAGNOSIS — F329 Major depressive disorder, single episode, unspecified: Secondary | ICD-10-CM | POA: Insufficient documentation

## 2012-10-27 DIAGNOSIS — N83209 Unspecified ovarian cyst, unspecified side: Secondary | ICD-10-CM

## 2012-10-27 DIAGNOSIS — B9689 Other specified bacterial agents as the cause of diseases classified elsewhere: Secondary | ICD-10-CM | POA: Insufficient documentation

## 2012-10-27 DIAGNOSIS — R102 Pelvic and perineal pain: Secondary | ICD-10-CM

## 2012-10-27 DIAGNOSIS — Z8639 Personal history of other endocrine, nutritional and metabolic disease: Secondary | ICD-10-CM | POA: Insufficient documentation

## 2012-10-27 LAB — CBC WITH DIFFERENTIAL/PLATELET
Basophils Absolute: 0 10*3/uL (ref 0.0–0.1)
Basophils Relative: 0 % (ref 0–1)
Eosinophils Relative: 1 % (ref 0–5)
HCT: 35 % — ABNORMAL LOW (ref 36.0–46.0)
Hemoglobin: 11.7 g/dL — ABNORMAL LOW (ref 12.0–15.0)
MCH: 29 pg (ref 26.0–34.0)
MCHC: 33.4 g/dL (ref 30.0–36.0)
MCV: 86.8 fL (ref 78.0–100.0)
Monocytes Absolute: 0.9 10*3/uL (ref 0.1–1.0)
Monocytes Relative: 10 % (ref 3–12)
RDW: 15 % (ref 11.5–15.5)

## 2012-10-27 LAB — COMPREHENSIVE METABOLIC PANEL
AST: 16 U/L (ref 0–37)
Albumin: 3.3 g/dL — ABNORMAL LOW (ref 3.5–5.2)
BUN: 16 mg/dL (ref 6–23)
Calcium: 9.6 mg/dL (ref 8.4–10.5)
Creatinine, Ser: 0.92 mg/dL (ref 0.50–1.10)
GFR calc non Af Amer: 82 mL/min — ABNORMAL LOW (ref >90)
Total Bilirubin: 0.1 mg/dL — ABNORMAL LOW (ref 0.3–1.2)

## 2012-10-27 LAB — LIPASE, BLOOD: Lipase: 50 U/L (ref 11–59)

## 2012-10-27 LAB — URINALYSIS, ROUTINE W REFLEX MICROSCOPIC
Bilirubin Urine: NEGATIVE
Glucose, UA: NEGATIVE mg/dL
Ketones, ur: NEGATIVE mg/dL
Leukocytes, UA: NEGATIVE
Protein, ur: NEGATIVE mg/dL
pH: 6 (ref 5.0–8.0)

## 2012-10-27 LAB — URINE MICROSCOPIC-ADD ON

## 2012-10-27 LAB — WET PREP, GENITAL: Yeast Wet Prep HPF POC: NONE SEEN

## 2012-10-27 LAB — HIV ANTIBODY (ROUTINE TESTING W REFLEX): HIV: NONREACTIVE

## 2012-10-27 LAB — RPR: RPR Ser Ql: NONREACTIVE

## 2012-10-27 MED ORDER — KETOROLAC TROMETHAMINE 30 MG/ML IJ SOLN
30.0000 mg | Freq: Once | INTRAMUSCULAR | Status: AC
  Administered 2012-10-27: 30 mg via INTRAVENOUS
  Filled 2012-10-27: qty 1

## 2012-10-27 MED ORDER — POTASSIUM CHLORIDE CRYS ER 20 MEQ PO TBCR
40.0000 meq | EXTENDED_RELEASE_TABLET | Freq: Once | ORAL | Status: AC
  Administered 2012-10-27: 40 meq via ORAL
  Filled 2012-10-27: qty 2

## 2012-10-27 MED ORDER — MORPHINE SULFATE 4 MG/ML IJ SOLN
4.0000 mg | INTRAMUSCULAR | Status: AC | PRN
  Administered 2012-10-27 (×2): 4 mg via INTRAVENOUS
  Filled 2012-10-27 (×2): qty 1

## 2012-10-27 MED ORDER — MORPHINE SULFATE 4 MG/ML IJ SOLN
4.0000 mg | Freq: Once | INTRAMUSCULAR | Status: AC
  Administered 2012-10-27: 4 mg via INTRAVENOUS
  Filled 2012-10-27: qty 1

## 2012-10-27 MED ORDER — FUROSEMIDE 40 MG PO TABS
40.0000 mg | ORAL_TABLET | Freq: Once | ORAL | Status: AC
  Administered 2012-10-27: 40 mg via ORAL
  Filled 2012-10-27: qty 1

## 2012-10-27 MED ORDER — OXYCODONE-ACETAMINOPHEN 5-325 MG PO TABS: 1.0000 | ORAL_TABLET | ORAL | Status: DC | PRN

## 2012-10-27 MED ORDER — METRONIDAZOLE 500 MG PO TABS: 500.0000 mg | ORAL_TABLET | Freq: Two times a day (BID) | ORAL | Status: DC

## 2012-10-27 NOTE — ED Notes (Signed)
Returned from [place].  Pain at 10/10.  Medicated with 4mg  MSO4 VI.  Patient relates her menstrual cycles have been erratic for several months, lasting up to 14 days.

## 2012-10-27 NOTE — ED Notes (Signed)
Pt to department via EMS.  Reporting lower abdominal pain starting today.  Nausea, no vomiting.

## 2012-10-27 NOTE — ED Notes (Signed)
Pt resting quietly in room.  Pt awaiting for ultrasound.

## 2012-10-27 NOTE — ED Provider Notes (Signed)
CSN: [id number]     Arrival date & time 10/27/12  0129 History   First MD Initiated Contact with Patient 10/27/12 [phone]     Chief Complaint  Patient presents with  . Abdominal Pain   (Consider location/radiation/quality/duration/timing/severity/associated sxs/prior Treatment) Patient is a 32 y.o. female presenting with abdominal pain. The history is provided by the patient.  Abdominal Pain She had sudden onset of suprapubic pain at about noon. The pain waxes and wanes between 10/10 and 7/10. Nothing seems to make it better nothing seems to make it worse although she felt slightly better if she crawled up in a fetal position. There has been no radiation of pain. She denies nausea, vomiting, diarrhea. She denies vaginal discharge. She denies urinary urgency, frequency, tenesmus, dysuria. Last menses was about 3 weeks ago but she states that recently he menses have been heavier than normal. She has not taken anything for pain. Also, she noted swelling in her hands yesterday to the point where her rings were uncomfortable.  Past Medical History  Diagnosis Date  . Hyperlipidemia   . Depression   . Anxiety    Past Surgical History  Procedure Laterality Date  . Cesarean section    . Tubal ligation     Family History  Problem Relation Age of Onset  . Hypertension Father   . Cancer Paternal Aunt     BREAST  . COPD Paternal Grandfather   . Hyperlipidemia Paternal Grandfather   . Hypertension Paternal Grandfather    History  Substance Use Topics  . Smoking status: Current Some Day Smoker -- 0.25 packs/day    Types: Cigarettes  . Smokeless tobacco: Not on file  . Alcohol Use: No   OB History   Grav Para Term Preterm Abortions TAB SAB Ect Mult Living                 Review of Systems  Gastrointestinal: Positive for abdominal pain.  All other systems reviewed and are negative.    Allergies  Review of patient's allergies indicates no known allergies.  Home Medications   Current  Outpatient Rx  Name  Route  Sig  Dispense  Refill  . clonazePAM (KLONOPIN) 0.5 MG tablet   Oral   Take 0.5 mg by mouth 3 (three) times daily as needed for anxiety.         Marland Kitchen omeprazole (PRILOSEC) 20 MG capsule   Oral   Take 20 mg by mouth 2 (two) times daily.         Marland Kitchen oxyCODONE-acetaminophen (PERCOCET/ROXICET) 5-325 MG per tablet   Oral   Take 1 tablet by mouth every 4 (four) hours as needed for pain.   10 tablet   0   . sertraline (ZOLOFT) 100 MG tablet   Oral   Take 1 tablet (100 mg total) by mouth daily.   30 tablet   3    BP 127/78  Pulse 86  Temp(Src) 98.7 F (37.1 C) (Oral)  Resp 18  Ht 5\' 6"  (1.676 m)  Wt 186 lb (84.369 kg)  BMI 30.04 kg/m2  SpO2 99% Physical Exam  Nursing note and vitals reviewed.  32 year old female, resting comfortably and in no acute distress. Vital signs are normal. Oxygen saturation is 99%, which is normal. Head is normocephalic and atraumatic. PERRLA, EOMI. Oropharynx is clear. Neck is nontender and supple without adenopathy or JVD. Back is nontender and there is no CVA tenderness. Lungs are clear without rales, wheezes, or rhonchi. Chest is  nontender. Heart has regular rate and rhythm without murmur. Abdomen is soft, flat, with moderate tenderness in the suprapubic area in the midline. No other abdominal tenderness is identified. There is no rebound or guarding. There are no masses or hepatosplenomegaly and peristalsis is normoactive. Pelvic: Normal external female genitalia, cervix is normal, no discharge seen. Fundus is top normal in size. There is marked tenderness to palpation of the uterine fundus as well as cervix. There no adnexal masses or tenderness. Extremities have trace edema, full range of motion is present. Skin is warm and dry without rash. Neurologic: Mental status is normal, cranial nerves are intact, there are no motor or sensory deficits.  ED Course  Procedures (including critical care time) Labs Review Results  for orders placed during the hospital encounter of 10/27/12  WET PREP, GENITAL      Result Value Range   Yeast Wet Prep HPF POC NONE SEEN  NONE SEEN   Trich, Wet Prep NONE SEEN  NONE SEEN   Clue Cells Wet Prep HPF POC MODERATE (*) NONE SEEN   WBC, Wet Prep HPF POC FEW (*) NONE SEEN  URINALYSIS, ROUTINE W REFLEX MICROSCOPIC      Result Value Range   Color, Urine YELLOW  YELLOW   APPearance CLEAR  CLEAR   Specific Gravity, Urine >1.030 (*) 1.005 - 1.030   pH 6.0  5.0 - 8.0   Glucose, UA NEGATIVE  NEGATIVE mg/dL   Hgb urine dipstick SMALL (*) NEGATIVE   Bilirubin Urine NEGATIVE  NEGATIVE   Ketones, ur NEGATIVE  NEGATIVE mg/dL   Protein, ur NEGATIVE  NEGATIVE mg/dL   Urobilinogen, UA 0.2  0.0 - 1.0 mg/dL   Nitrite NEGATIVE  NEGATIVE   Leukocytes, UA NEGATIVE  NEGATIVE  PREGNANCY, URINE      Result Value Range   Preg Test, Ur NEGATIVE  NEGATIVE  URINE MICROSCOPIC-ADD ON      Result Value Range   Squamous Epithelial / LPF FEW (*) RARE   WBC, UA 0-2  <3 WBC/hpf   RBC / HPF 0-2  <3 RBC/hpf   Bacteria, UA RARE  RARE  CBC WITH DIFFERENTIAL      Result Value Range   WBC 8.9  4.0 - 10.5 K/uL   RBC 4.03  3.87 - 5.11 MIL/uL   Hemoglobin 11.7 (*) 12.0 - 15.0 g/dL   HCT 16.1 (*) 09.6 - 04.5 %   MCV 86.8  78.0 - 100.0 fL   MCH 29.0  26.0 - 34.0 pg   MCHC 33.4  30.0 - 36.0 g/dL   RDW 40.9  81.1 - 91.4 %   Platelets 236  150 - 400 K/uL   Neutrophils Relative % 58  43 - 77 %   Neutro Abs 5.1  1.7 - 7.7 K/uL   Lymphocytes Relative 31  12 - 46 %   Lymphs Abs 2.8  0.7 - 4.0 K/uL   Monocytes Relative 10  3 - 12 %   Monocytes Absolute 0.9  0.1 - 1.0 K/uL   Eosinophils Relative 1  0 - 5 %   Eosinophils Absolute 0.1  0.0 - 0.7 K/uL   Basophils Relative 0  0 - 1 %   Basophils Absolute 0.0  0.0 - 0.1 K/uL  COMPREHENSIVE METABOLIC PANEL      Result Value Range   Sodium 136  135 - 145 mEq/L   Potassium 3.8  3.5 - 5.1 mEq/L   Chloride 101  96 - 112 mEq/L  CO2 25  19 - 32 mEq/L   Glucose,  Bld 83  70 - 99 mg/dL   BUN 16  6 - 23 mg/dL   Creatinine, Ser 1.61  0.50 - 1.10 mg/dL   Calcium 9.6  8.4 - 09.6 mg/dL   Total Protein 7.5  6.0 - 8.3 g/dL   Albumin 3.3 (*) 3.5 - 5.2 g/dL   AST 16  0 - 37 U/L   ALT 12  0 - 35 U/L   Alkaline Phosphatase 62  39 - 117 U/L   Total Bilirubin 0.1 (*) 0.3 - 1.2 mg/dL   GFR calc non Af Amer 82 (*) >90 mL/min   GFR calc Af Amer >90  >90 mL/min  LIPASE, BLOOD      Result Value Range   Lipase 50  11 - 59 U/L    MDM   1. Pelvic pain   2. Bacterial vaginosis    Pelvic pain of uncertain cause. Workup is initiated. She is given a dose of ketorolac for pain.  She did not get adequate relief of pain with ketorolac and so was given morphine. After pelvic exam was completed, it seemed that her uterus was the source of her pain so pelvic ultrasound has been ordered. Laboratory workup is unremarkable. Case is signed out to Dr. Juleen China to check on her ultrasound report.    Dione Booze, MD 10/27/12 [phone]

## 2012-10-27 NOTE — ED Notes (Signed)
Patient with no complaints at this time. Respirations even and unlabored. Skin warm/dry. Discharge instructions reviewed with patient at this time. Patient given opportunity to voice concerns/ask questions. IV removed per policy and band-aid applied to site. Patient discharged at this time and left Emergency Department with steady gait.  

## 2012-10-27 NOTE — ED Notes (Signed)
Patient states that she is still hurting. It feels like someone is twisting her insides. Wanting to know if all her test results have come back.

## 2012-10-28 LAB — GC/CHLAMYDIA PROBE AMP: CT Probe RNA: NEGATIVE

## 2012-11-19 ENCOUNTER — Telehealth: Payer: Self-pay | Admitting: Family Medicine

## 2012-11-19 ENCOUNTER — Other Ambulatory Visit: Payer: Self-pay | Admitting: Family Medicine

## 2012-11-19 DIAGNOSIS — F410 Panic disorder [episodic paroxysmal anxiety] without agoraphobia: Secondary | ICD-10-CM

## 2012-11-19 DIAGNOSIS — F411 Generalized anxiety disorder: Secondary | ICD-10-CM

## 2012-11-19 MED ORDER — SERTRALINE HCL 100 MG PO TABS: 100.0000 mg | ORAL_TABLET | Freq: Every day | ORAL | Status: DC

## 2012-11-19 NOTE — Telephone Encounter (Signed)
Patient needs to be seen. Has exceeded time since last visit. Needs to bring all medications to next appointment. Past due. Last refill.

## 2012-11-21 ENCOUNTER — Ambulatory Visit: Payer: Medicaid Other | Admitting: Family Medicine

## 2012-11-21 NOTE — Telephone Encounter (Signed)
Left message with the patient she needs appt.

## 2012-12-23 ENCOUNTER — Ambulatory Visit (INDEPENDENT_AMBULATORY_CARE_PROVIDER_SITE_OTHER): Payer: Medicaid Other | Admitting: Family Medicine

## 2012-12-23 ENCOUNTER — Encounter: Payer: Self-pay | Admitting: Family Medicine

## 2012-12-23 VITALS — BP 109/83 | HR 86 | Temp 98.8°F | Ht 67.0 in | Wt 191.0 lb

## 2012-12-23 DIAGNOSIS — G4700 Insomnia, unspecified: Secondary | ICD-10-CM

## 2012-12-23 DIAGNOSIS — B353 Tinea pedis: Secondary | ICD-10-CM

## 2012-12-23 DIAGNOSIS — R300 Dysuria: Secondary | ICD-10-CM

## 2012-12-23 DIAGNOSIS — R635 Abnormal weight gain: Secondary | ICD-10-CM

## 2012-12-23 LAB — POCT UA - MICROSCOPIC ONLY
Casts, Ur, LPF, POC: NEGATIVE
Crystals, Ur, HPF, POC: NEGATIVE
Mucus, UA: NEGATIVE
RBC, urine, microscopic: NEGATIVE
Yeast, UA: NEGATIVE

## 2012-12-23 LAB — POCT URINALYSIS DIPSTICK
Bilirubin, UA: NEGATIVE
Blood, UA: NEGATIVE
Glucose, UA: NEGATIVE
Ketones, UA: NEGATIVE
Nitrite, UA: POSITIVE
Protein, UA: NEGATIVE
Spec Grav, UA: 1.02
Urobilinogen, UA: NEGATIVE
pH, UA: 5

## 2012-12-23 MED ORDER — CIPROFLOXACIN HCL 500 MG PO TABS: 500.0000 mg | ORAL_TABLET | Freq: Two times a day (BID) | ORAL | Status: DC

## 2012-12-23 MED ORDER — KETOCONAZOLE 2 % EX CREA: 1.0000 "application " | TOPICAL_CREAM | Freq: Every day | CUTANEOUS | Status: DC

## 2012-12-23 MED ORDER — FLUCONAZOLE 150 MG PO TABS: 150.0000 mg | ORAL_TABLET | Freq: Every day | ORAL | Status: DC

## 2012-12-23 MED ORDER — QUETIAPINE FUMARATE 100 MG PO TABS: 100.0000 mg | ORAL_TABLET | Freq: Every day | ORAL | Status: DC

## 2012-12-23 NOTE — Patient Instructions (Signed)

## 2012-12-23 NOTE — Progress Notes (Signed)
  Subjective:    Patient ID: [name] [name], female    DOB: 08/17/1980, 32 y.o.   MRN: [id number]  HPI This 32 y.o. female presents for evaluation of multiple medical problems.  She states She doesn't sleep and she stays up for days.  She has depression and she states She has been dx with agoraphobia.  She is due to see psychiatry in a few weeks.  She Has a rash on her abdomen.  She has a rash on her feet.  She dysuria and urinary frequency. She states she was feeling better when she was taking clonazepam at hs and ativan in the Daytime.   Review of Systems C/o rash, anxiety, and dysuria. No chest pain, SOB, HA, dizziness, vision change, N/V, diarrhea, constipation, myalgias, arthralgias.     Objective:   Physical Exam Vital signs noted  Well developed well nourished anxious female.  HEENT - Head atraumatic Normocephalic                Eyes - PERRLA, Conjuctiva - clear Sclera- Clear EOMI                Ears - EAC's Wnl TM's Wnl Gross Hearing WNL                Nose - Nares patent                 Throat - oropharanx wnl Respiratory - Lungs CTA bilateral Cardiac - RRR S1 and S2 without murmur GI - Abdomen soft Nontender and bowel sounds active x 4 Extremities - No edema. Neuro - Grossly intact. Skin - Dry erythematous skin on lateral right foot.  Deatra Canter FNP      Assessment & Plan:  Dysuria - Plan: POCT UA - Microscopic Only, POCT urinalysis dipstick, ciprofloxacin (CIPRO) 500 MG tablet, fluconazole (DIFLUCAN) 150 MG tablet  Insomnia - Plan: QUEtiapine (SEROQUEL) 100 MG tablet po qhs  Weight gain - Plan: POCT CBC, CMP14+EGFR, Thyroid Panel With TSH  Tinea pedis - Plan: ketoconazole (NIZORAL) 2 % cream bid x 7 days  Continue to follow up with psychiatry.  Deatra Canter FNP

## 2012-12-23 NOTE — Addendum Note (Signed)
Addended by: Orma Render F on: 12/23/2012 04:57 PM   Modules accepted: Orders

## 2012-12-24 LAB — CMP14+EGFR
ALT: 13 IU/L (ref 0–32)
AST: 18 IU/L (ref 0–40)
Albumin/Globulin Ratio: 1.4 (ref 1.1–2.5)
Albumin: 4.3 g/dL (ref 3.5–5.5)
Alkaline Phosphatase: 64 IU/L (ref 39–117)
BUN/Creatinine Ratio: 13 (ref 8–20)
BUN: 11 mg/dL (ref 6–20)
CO2: 22 mmol/L (ref 18–29)
Calcium: 9.4 mg/dL (ref 8.7–10.2)
Chloride: 101 mmol/L (ref 97–108)
Creatinine, Ser: 0.83 mg/dL (ref 0.57–1.00)
GFR calc Af Amer: 108 mL/min/{1.73_m2} (ref >59)
GFR calc non Af Amer: 94 mL/min/{1.73_m2} (ref >59)
Globulin, Total: 3 g/dL (ref 1.5–4.5)
Glucose: 89 mg/dL (ref 65–99)
Potassium: 4.5 mmol/L (ref 3.5–5.2)
Sodium: 138 mmol/L (ref 134–144)
Total Bilirubin: <0.2 mg/dL (ref 0.0–1.2)
Total Protein: 7.3 g/dL (ref 6.0–8.5)

## 2012-12-24 LAB — CBC WITH DIFFERENTIAL
Basophils Absolute: 0 10*3/uL (ref 0.0–0.2)
Basos: 0 %
Eos: 2 %
Eosinophils Absolute: 0.1 10*3/uL (ref 0.0–0.4)
HCT: 37 % (ref 34.0–46.6)
Hemoglobin: 12.5 g/dL (ref 11.1–15.9)
Immature Grans (Abs): 0 10*3/uL (ref 0.0–0.1)
Immature Granulocytes: 0 %
Lymphocytes Absolute: 2.4 10*3/uL (ref 0.7–3.1)
Lymphs: 41 %
MCH: 28.8 pg (ref 26.6–33.0)
MCHC: 33.8 g/dL (ref 31.5–35.7)
MCV: 85 fL (ref 79–97)
Monocytes Absolute: 0.7 10*3/uL (ref 0.1–0.9)
Monocytes: 12 %
Neutrophils Absolute: 2.6 10*3/uL (ref 1.4–7.0)
Neutrophils Relative %: 45 %
Platelets: 253 10*3/uL (ref 150–379)
RBC: 4.34 x10E6/uL (ref 3.77–5.28)
RDW: 14.6 % (ref 12.3–15.4)
WBC: 5.7 10*3/uL (ref 3.4–10.8)

## 2012-12-24 LAB — THYROID PANEL WITH TSH
Free Thyroxine Index: 1.8 (ref 1.2–4.9)
T3 Uptake Ratio: 28 % (ref 24–39)
T4, Total: 6.5 ug/dL (ref 4.5–12.0)
TSH: 1.21 u[IU]/mL (ref 0.450–4.500)

## 2012-12-25 ENCOUNTER — Telehealth: Payer: Self-pay | Admitting: Family Medicine

## 2012-12-25 ENCOUNTER — Other Ambulatory Visit: Payer: Self-pay | Admitting: Family Medicine

## 2012-12-25 NOTE — Telephone Encounter (Signed)
DONE

## 2013-02-17 ENCOUNTER — Encounter: Payer: Self-pay | Admitting: Family Medicine

## 2013-02-17 ENCOUNTER — Ambulatory Visit (INDEPENDENT_AMBULATORY_CARE_PROVIDER_SITE_OTHER): Payer: Medicaid Other

## 2013-02-17 ENCOUNTER — Ambulatory Visit (INDEPENDENT_AMBULATORY_CARE_PROVIDER_SITE_OTHER): Payer: Medicaid Other | Admitting: Family Medicine

## 2013-02-17 ENCOUNTER — Telehealth: Payer: Self-pay | Admitting: Family Medicine

## 2013-02-17 VITALS — BP 144/95 | HR 106 | Temp 97.6°F | Ht 67.0 in | Wt 187.0 lb

## 2013-02-17 DIAGNOSIS — F438 Other reactions to severe stress: Secondary | ICD-10-CM

## 2013-02-17 DIAGNOSIS — R0602 Shortness of breath: Secondary | ICD-10-CM | POA: Insufficient documentation

## 2013-02-17 DIAGNOSIS — F4329 Adjustment disorder with other symptoms: Secondary | ICD-10-CM | POA: Insufficient documentation

## 2013-02-17 DIAGNOSIS — F4389 Other reactions to severe stress: Secondary | ICD-10-CM

## 2013-02-17 DIAGNOSIS — F410 Panic disorder [episodic paroxysmal anxiety] without agoraphobia: Secondary | ICD-10-CM | POA: Insufficient documentation
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
## 2013-02-17 NOTE — Telephone Encounter (Signed)
Appt given for today 

## 2013-02-28 ENCOUNTER — Other Ambulatory Visit: Payer: Self-pay | Admitting: Family Medicine

## 2013-03-05 ENCOUNTER — Other Ambulatory Visit: Payer: Self-pay | Admitting: Family Medicine

## 2013-05-13 ENCOUNTER — Encounter (INDEPENDENT_AMBULATORY_CARE_PROVIDER_SITE_OTHER): Payer: Self-pay

## 2013-05-13 ENCOUNTER — Ambulatory Visit (INDEPENDENT_AMBULATORY_CARE_PROVIDER_SITE_OTHER): Payer: Medicaid Other | Admitting: Family Medicine

## 2013-05-13 ENCOUNTER — Telehealth: Payer: Self-pay | Admitting: Family Medicine

## 2013-05-13 NOTE — Telephone Encounter (Signed)
appt today at [id number]

## 2013-05-22 ENCOUNTER — Ambulatory Visit: Payer: Medicaid Other | Admitting: Family Medicine

## 2013-06-19 ENCOUNTER — Encounter: Payer: Self-pay | Admitting: *Deleted

## 2013-06-19 ENCOUNTER — Ambulatory Visit (INDEPENDENT_AMBULATORY_CARE_PROVIDER_SITE_OTHER): Payer: Medicaid Other | Admitting: Family Medicine

## 2013-06-19 VITALS — BP 119/80 | HR 62 | Temp 96.8°F | Ht 67.0 in | Wt 178.2 lb

## 2013-06-19 DIAGNOSIS — G43909 Migraine, unspecified, not intractable, without status migrainosus: Secondary | ICD-10-CM

## 2013-06-19 DIAGNOSIS — J329 Chronic sinusitis, unspecified: Secondary | ICD-10-CM

## 2013-06-19 MED ORDER — SUMATRIPTAN SUCCINATE 100 MG PO TABS: 100.0000 mg | ORAL_TABLET | ORAL | Status: DC | PRN

## 2013-06-19 MED ORDER — FLUCONAZOLE 150 MG PO TABS: 150.0000 mg | ORAL_TABLET | Freq: Once | ORAL | Status: DC

## 2013-06-19 MED ORDER — AMOXICILLIN 875 MG PO TABS: 875.0000 mg | ORAL_TABLET | Freq: Two times a day (BID) | ORAL | Status: DC

## 2013-06-19 MED ORDER — TOPIRAMATE 50 MG PO TABS: 50.0000 mg | ORAL_TABLET | Freq: Two times a day (BID) | ORAL | Status: DC

## 2013-06-19 MED ORDER — METHYLPREDNISOLONE (PAK) 4 MG PO TABS: ORAL_TABLET | ORAL | Status: DC

## 2013-06-19 MED ORDER — KETOROLAC TROMETHAMINE 30 MG/ML IJ SOLN
30.0000 mg | Freq: Once | INTRAMUSCULAR | Status: AC
  Administered 2013-06-19: 30 mg via INTRAMUSCULAR

## 2013-06-19 NOTE — Addendum Note (Signed)
Addended by: Gwenith DailyHUDY, KRISTEN N on: 06/19/2013 11:18 AM   Modules accepted: Orders

## 2013-06-19 NOTE — Progress Notes (Signed)
   Subjective:    Patient ID: [name] GrayerAmanda K [name], female    DOB: 05/06/1980, 33 y.o.   MRN: [id number]  HPI This 33 y.o. female presents for evaluation of headaches, phonophotophobia, nausea,  URI sx's, and fatigue.  She is a smoker.  She has aura with her headaches.  She has Been having these headaches every day now for several months.  She has been tx'd for Bronchitis at urgent care a few weeks ago and feels like the [place] is still present.   Review of Systems C/o headache, URI, fatigue, and phonophotophobia No chest pain, SOB, dizziness, vision change, N/V, diarrhea, constipation, dysuria, urinary urgency or frequency, myalgias, arthralgias or rash.     Objective:   Physical Exam Vital signs noted  Well developed well nourished female.  HEENT - Head atraumatic Normocephalic                Eyes - PERRLA, Conjuctiva - clear Sclera- Clear EOMI                Ears - EAC's Wnl TM's Wnl Gross Hearing WNL                 Throat - oropharanx wnl Respiratory - Lungs CTA bilateral Cardiac - RRR S1 and S2 without murmur GI - Abdomen soft Nontender and bowel sounds active x 4 Extremities - No edema. Neuro - Grossly intact.       Assessment & Plan:  Migraine headache - Plan: methylPREDNIsolone (MEDROL DOSPACK) 4 MG tablet, SUMAtriptan (IMITREX) 100 MG tablet, topiramate (TOPAMAX) 50 MG tablet, ketorolac (TORADOL) 30 MG/ML injection 30 mg  Sinusitis - Plan: amoxicillin (AMOXIL) 875 MG tablet, methylPREDNIsolone (MEDROL DOSPACK) 4 MG tablet, fluconazole (DIFLUCAN) 150 MG tablet  Tobacco abuse - encouraged patient to stop smoking  [name] CanterWilliam J [name] Weide FNP

## 2013-06-19 NOTE — Addendum Note (Signed)
Addended by: Deatra CanterXFORD, WILLIAM J on: 06/19/2013 10:17 AM   Modules accepted: Level of Service

## 2013-06-27 ENCOUNTER — Ambulatory Visit: Payer: Medicaid Other | Admitting: Family Medicine

## 2013-07-04 ENCOUNTER — Ambulatory Visit: Payer: Medicaid Other | Admitting: Family Medicine

## 2013-07-14 ENCOUNTER — Emergency Department (HOSPITAL_COMMUNITY)
Admission: EM | Admit: 2013-07-14 | Discharge: 2013-07-14 | Disposition: A | Discharge status: Home / Self Care | Payer: Medicaid Other | Attending: Emergency Medicine | Admitting: Emergency Medicine

## 2013-07-14 ENCOUNTER — Encounter (HOSPITAL_COMMUNITY): Payer: Self-pay | Admitting: Emergency Medicine

## 2013-07-14 DIAGNOSIS — E785 Hyperlipidemia, unspecified: Secondary | ICD-10-CM | POA: Insufficient documentation

## 2013-07-14 DIAGNOSIS — S39012A Strain of muscle, fascia and tendon of lower back, initial encounter: Secondary | ICD-10-CM

## 2013-07-14 DIAGNOSIS — W19XXXA Unspecified fall, initial encounter: Secondary | ICD-10-CM

## 2013-07-14 DIAGNOSIS — F3289 Other specified depressive episodes: Secondary | ICD-10-CM | POA: Insufficient documentation

## 2013-07-14 DIAGNOSIS — Z79899 Other long term (current) drug therapy: Secondary | ICD-10-CM | POA: Insufficient documentation

## 2013-07-14 DIAGNOSIS — F329 Major depressive disorder, single episode, unspecified: Secondary | ICD-10-CM | POA: Insufficient documentation

## 2013-07-14 DIAGNOSIS — Y92410 Unspecified street and highway as the place of occurrence of the external cause: Secondary | ICD-10-CM | POA: Insufficient documentation

## 2013-07-14 DIAGNOSIS — Y9389 Activity, other specified: Secondary | ICD-10-CM | POA: Insufficient documentation

## 2013-07-14 DIAGNOSIS — F411 Generalized anxiety disorder: Secondary | ICD-10-CM | POA: Insufficient documentation

## 2013-07-14 DIAGNOSIS — F17200 Nicotine dependence, unspecified, uncomplicated: Secondary | ICD-10-CM | POA: Insufficient documentation

## 2013-07-14 DIAGNOSIS — IMO0002 Reserved for concepts with insufficient information to code with codable children: Secondary | ICD-10-CM | POA: Insufficient documentation

## 2013-07-14 MED ORDER — OXYCODONE-ACETAMINOPHEN 5-325 MG PO TABS
1.0000 | ORAL_TABLET | Freq: Once | ORAL | Status: AC
  Administered 2013-07-14: 1 via ORAL
  Filled 2013-07-14: qty 1

## 2013-07-14 MED ORDER — HYDROCODONE-ACETAMINOPHEN 5-325 MG PO TABS: 1.0000 | ORAL_TABLET | ORAL | Status: DC | PRN

## 2013-07-14 MED ORDER — DIAZEPAM 5 MG PO TABS: 5.0000 mg | ORAL_TABLET | Freq: Two times a day (BID) | ORAL | Status: DC

## 2013-07-14 NOTE — Discharge Instructions (Signed)
Take Valium as needed as directed for muscle spasm. No driving or operating heavy machinery while taking valium. This medication may cause drowsiness. Take Vicodin for severe pain only. No driving or operating heavy machinery while taking vicodin. This medication may cause drowsiness. Rest, avoid heavy lifting or hard physical activity for the next few days. Apply ice intermittently 15 minutes on and off for the next 24 hours followed by heat.  Muscle Strain A muscle strain is an injury that occurs when a muscle is stretched beyond its normal length. Usually a small number of muscle fibers are torn when this happens. Muscle strain is rated in degrees. First-degree strains have the least amount of muscle fiber tearing and pain. Second-degree and third-degree strains have increasingly more tearing and pain.  Usually, recovery from muscle strain takes 1 2 weeks. Complete healing takes 5 6 weeks.  CAUSES  Muscle strain happens when a sudden, violent force placed on a muscle stretches it too far. This may occur with lifting, sports, or a fall.  RISK FACTORS Muscle strain is especially common in athletes.  SIGNS AND SYMPTOMS At the site of the muscle strain, there may be:  Pain.  Bruising.  Swelling.  Difficulty using the muscle due to pain or lack of normal function. DIAGNOSIS  Your health care provider will perform a physical exam and ask about your medical history. TREATMENT  Often, the best treatment for a muscle strain is resting, icing, and applying cold compresses to the injured area.  HOME CARE INSTRUCTIONS   Use the PRICE method of treatment to promote muscle healing during the first 2 3 days after your injury. The PRICE method involves:  Protecting the muscle from being injured again.  Restricting your activity and resting the injured body part.  Icing your injury. To do this, put ice in a plastic bag. Place a towel between your skin and the bag. Then, apply the ice and leave it  on from 15 20 minutes each hour. After the third day, switch to moist heat packs.  Apply compression to the injured area with a splint or elastic bandage. Be careful not to wrap it too tightly. This may interfere with blood circulation or increase swelling.  Elevate the injured body part above the level of your heart as often as you can.  Only take over-the-counter or prescription medicines for pain, discomfort, or fever as directed by your health care provider.  Warming up prior to exercise helps to prevent future muscle strains. SEEK MEDICAL CARE IF:   You have increasing pain or swelling in the injured area.  You have numbness, tingling, or a significant loss of strength in the injured area. MAKE SURE YOU:   Understand these instructions.  Will watch your condition.  Will get help right away if you are not doing well or get worse. Document Released: 01/30/2005 Document Revised: 11/20/2012 Document Reviewed: 08/29/2012 [facility] Patient Information 2014 [place], [place].  Lumbosacral Strain Lumbosacral strain is a strain of any of the parts that make up your lumbosacral vertebrae. Your lumbosacral vertebrae are the bones that make up the lower third of your backbone. Your lumbosacral vertebrae are held together by muscles and tough, fibrous tissue (ligaments).  CAUSES  A sudden blow to your back can cause lumbosacral strain. Also, anything that causes an excessive stretch of the muscles in the low back can cause this strain. This is typically seen when people exert themselves strenuously, fall, lift heavy objects, bend, or crouch repeatedly. RISK FACTORS  Physically demanding work.  Participation in pushing or pulling sports or sports that require sudden twist of the back (tennis, golf, baseball).  Weight lifting.  Excessive lower back curvature.  Forward-tilted pelvis.  Weak back or abdominal muscles or both.  Tight hamstrings. SIGNS AND SYMPTOMS  Lumbosacral strain may  cause pain in the area of your injury or pain that moves (radiates) down your leg.  DIAGNOSIS Your health care provider can often diagnose lumbosacral strain through a physical exam. In some cases, you may need tests such as X-ray exams.  TREATMENT  Treatment for your lower back injury depends on many factors that your clinician will have to evaluate. However, most treatment will include the use of anti-inflammatory medicines. HOME CARE INSTRUCTIONS   Avoid hard physical activities (tennis, racquetball, waterskiing) if you are not in proper physical condition for it. This may aggravate or create problems.  If you have a back problem, avoid sports requiring sudden body movements. Swimming and walking are generally safer activities.  Maintain good posture.  Maintain a healthy weight.  For acute conditions, you may put ice on the injured area.  Put ice in a plastic bag.  Place a towel between your skin and the bag.  Leave the ice on for 20 minutes, 2 3 times a day.  When the low back starts healing, stretching and strengthening exercises may be recommended. SEEK MEDICAL CARE IF:  Your back pain is getting worse.  You experience severe back pain not relieved with medicines. SEEK IMMEDIATE MEDICAL CARE IF:   You have numbness, tingling, weakness, or problems with the use of your arms or legs.  There is a change in bowel or bladder control.  You have increasing pain in any area of the body, including your belly (abdomen).  You notice shortness of breath, dizziness, or feel faint.  You feel sick to your stomach (nauseous), are throwing up (vomiting), or become sweaty.  You notice discoloration of your toes or legs, or your feet get very cold. MAKE SURE YOU:   Understand these instructions.  Will watch your condition.  Will get help right away if you are not doing well or get worse. Document Released: 11/09/2004 Document Revised: 11/20/2012 Document Reviewed:  09/18/2012 [facility] Patient Information 2014 [place], [place].
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
## 2013-07-14 NOTE — ED Notes (Signed)
Pt Fell off horse to day. Pt reports RT Lower back.

## 2013-07-15 NOTE — ED Provider Notes (Signed)
Medical screening examination/treatment/procedure(s) were performed by non-physician practitioner and as supervising physician I was immediately available for consultation/collaboration.     Joliet Mallozzi, MD 07/15/13 1527 

## 2013-10-07 ENCOUNTER — Encounter (HOSPITAL_COMMUNITY): Payer: Self-pay | Admitting: Emergency Medicine

## 2013-10-07 ENCOUNTER — Emergency Department (HOSPITAL_COMMUNITY)
Admission: EM | Admit: 2013-10-07 | Discharge: 2013-10-07 | Disposition: A | Discharge status: Home / Self Care | Payer: MEDICAID | Attending: Emergency Medicine | Admitting: Emergency Medicine

## 2013-10-07 DIAGNOSIS — F1110 Opioid abuse, uncomplicated: Secondary | ICD-10-CM | POA: Diagnosis not present

## 2013-10-07 DIAGNOSIS — F329 Major depressive disorder, single episode, unspecified: Secondary | ICD-10-CM | POA: Diagnosis not present

## 2013-10-07 DIAGNOSIS — F411 Generalized anxiety disorder: Secondary | ICD-10-CM | POA: Diagnosis not present

## 2013-10-07 DIAGNOSIS — F17200 Nicotine dependence, unspecified, uncomplicated: Secondary | ICD-10-CM | POA: Diagnosis not present

## 2013-10-07 DIAGNOSIS — F1310 Sedative, hypnotic or anxiolytic abuse, uncomplicated: Secondary | ICD-10-CM | POA: Insufficient documentation

## 2013-10-07 DIAGNOSIS — Z862 Personal history of diseases of the blood and blood-forming organs and certain disorders involving the immune mechanism: Secondary | ICD-10-CM | POA: Diagnosis not present

## 2013-10-07 DIAGNOSIS — F3289 Other specified depressive episodes: Secondary | ICD-10-CM | POA: Insufficient documentation

## 2013-10-07 DIAGNOSIS — Z79899 Other long term (current) drug therapy: Secondary | ICD-10-CM | POA: Diagnosis not present

## 2013-10-07 DIAGNOSIS — F1410 Cocaine abuse, uncomplicated: Secondary | ICD-10-CM | POA: Diagnosis not present

## 2013-10-07 DIAGNOSIS — Z8639 Personal history of other endocrine, nutritional and metabolic disease: Secondary | ICD-10-CM | POA: Diagnosis not present

## 2013-10-07 LAB — COMPREHENSIVE METABOLIC PANEL
ALK PHOS: 62 U/L (ref 39–117)
ALT: 8 U/L (ref 0–35)
AST: 12 U/L (ref 0–37)
Albumin: 3.8 g/dL (ref 3.5–5.2)
Anion gap: 11 (ref 5–15)
BILIRUBIN TOTAL: 0.3 mg/dL (ref 0.3–1.2)
BUN: 11 mg/dL (ref 6–23)
CALCIUM: 9.6 mg/dL (ref 8.4–10.5)
CHLORIDE: 102 meq/L (ref 96–112)
CO2: 26 meq/L (ref 19–32)
Creatinine, Ser: 0.79 mg/dL (ref 0.50–1.10)
GLUCOSE: 97 mg/dL (ref 70–99)
POTASSIUM: 4 meq/L (ref 3.7–5.3)
SODIUM: 139 meq/L (ref 137–147)
Total Protein: 7.6 g/dL (ref 6.0–8.3)

## 2013-10-07 LAB — CBC
HCT: 34.6 % — ABNORMAL LOW (ref 36.0–46.0)
HEMOGLOBIN: 11.7 g/dL — AB (ref 12.0–15.0)
MCH: 28.5 pg (ref 26.0–34.0)
MCHC: 33.8 g/dL (ref 30.0–36.0)
MCV: 84.2 fL (ref 78.0–100.0)
Platelets: 246 10*3/uL (ref 150–400)
RBC: 4.11 MIL/uL (ref 3.87–5.11)
RDW: 13.9 % (ref 11.5–15.5)
WBC: 10.7 10*3/uL — AB (ref 4.0–10.5)

## 2013-10-07 LAB — RAPID URINE DRUG SCREEN, HOSP PERFORMED
AMPHETAMINES: NOT DETECTED
Barbiturates: NOT DETECTED
Benzodiazepines: POSITIVE — AB
Cocaine: POSITIVE — AB
Opiates: POSITIVE — AB
TETRAHYDROCANNABINOL: NOT DETECTED

## 2013-10-07 LAB — ETHANOL: Alcohol, Ethyl (B): <11 mg/dL (ref 0–11)

## 2013-10-07 MED ORDER — ALUM & MAG HYDROXIDE-SIMETH 200-200-20 MG/5ML PO SUSP: 30.0000 mL | ORAL | Status: DC | PRN

## 2013-10-07 MED ORDER — ACETAMINOPHEN 325 MG PO TABS
650.0000 mg | ORAL_TABLET | ORAL | Status: DC | PRN
Start: 2013-10-07 — End: 2013-10-08

## 2013-10-07 MED ORDER — LORAZEPAM 1 MG PO TABS: 0.0000 mg | ORAL_TABLET | Freq: Two times a day (BID) | ORAL | Status: DC

## 2013-10-07 MED ORDER — NICOTINE 21 MG/24HR TD PT24: 21.0000 mg | MEDICATED_PATCH | Freq: Every day | TRANSDERMAL | Status: DC

## 2013-10-07 MED ORDER — ZOLPIDEM TARTRATE 5 MG PO TABS: 5.0000 mg | ORAL_TABLET | Freq: Every evening | ORAL | Status: DC | PRN

## 2013-10-07 MED ORDER — ONDANSETRON HCL 4 MG PO TABS: 4.0000 mg | ORAL_TABLET | Freq: Three times a day (TID) | ORAL | Status: DC | PRN

## 2013-10-07 MED ORDER — LORAZEPAM 1 MG PO TABS: 0.0000 mg | ORAL_TABLET | Freq: Four times a day (QID) | ORAL | Status: DC

## 2013-10-07 MED ORDER — IBUPROFEN 200 MG PO TABS: 600.0000 mg | ORAL_TABLET | Freq: Three times a day (TID) | ORAL | Status: DC | PRN

## 2013-10-07 NOTE — ED Provider Notes (Signed)
Patient accepted to [facility]. Vital stable. No distress.  BP 103/63  Pulse 76  Temp(Src) 98.2 F (36.8 C) (Oral)  Resp 16  Wt 188 lb (85.276 kg)  SpO2 98%  LMP 09/09/2013   Glynn Octave, MD 10/07/13 2243

## 2013-10-07 NOTE — ED Notes (Signed)
Pt belongings in locker 30.  

## 2013-10-07 NOTE — ED Provider Notes (Signed)
CSN: [id number]     Arrival date & time 10/07/13  1311 History  This chart was scribed for non-physician practitioner working with Toy Baker, MD by Elveria Rising, ED Scribe. This patient was seen in room WTR5/WTR5 and the patient's care was started at 2:20 PM.   Chief Complaint  Patient presents with  . Drug / Alcohol Assessment    HPI HPI Comments: [name] [name] is a 33 y.o. female who presents to the Emergency Department for drug assessment after an overdose today. Patient reports use of Valium and Xanax. She reports taking 7 tablets today.  Patient's significant other reports a break up one week ago after a 15 year relationship; he states that she need some kind of help.  He further shares that patient also abuses pain medication and uses crack. Patient denies use of alcohol or recent consumption.  Patient denies suicidal ideation.    Past Medical History  Diagnosis Date  . Hyperlipidemia   . Depression   . Anxiety    Past Surgical History  Procedure Laterality Date  . Cesarean section    . Tubal ligation     Family History  Problem Relation Age of Onset  . Hypertension Father   . Cancer Paternal Aunt     BREAST  . COPD Paternal Grandfather   . Hyperlipidemia Paternal Grandfather   . Hypertension Paternal Grandfather    History  Substance Use Topics  . Smoking status: Current Some Day Smoker -- 0.25 packs/day    Types: Cigarettes  . Smokeless tobacco: Not on file  . Alcohol Use: No   OB History   Grav Para Term Preterm Abortions TAB SAB Ect Mult Living                 Review of Systems A complete 10 system review of systems was obtained and all systems are negative except as noted in the HPI and PMH.    Allergies  Review of patient's allergies indicates no known allergies.  Home Medications   Prior to Admission medications   Medication Sig Start Date End Date Taking? Authorizing Provider  diazepam (VALIUM) 5 MG tablet Take 1 tablet (5 mg total) by  mouth 2 (two) times daily. 07/14/13  Yes Trevor Mace, PA-C  HYDROcodone-acetaminophen (NORCO/VICODIN) 5-325 MG per tablet Take 1-2 tablets by mouth every 4 (four) hours as needed. 07/14/13  Yes Trevor Mace, PA-C  sertraline (ZOLOFT) 100 MG tablet Take 150 mg by mouth daily.    Yes Historical Provider, MD  topiramate (TOPAMAX) 50 MG tablet Take 1 tablet (50 mg total) by mouth 2 (two) times daily. 06/19/13  Yes Deatra Canter, FNP  traZODone (DESYREL) 100 MG tablet Take 100 mg by mouth at bedtime.   Yes Historical Provider, MD   Triage Vitals: BP 112/72  Pulse 83  Temp(Src) 98 F (36.7 C) (Oral)  Resp 16  Wt 188 lb (85.276 kg)  SpO2 99%  LMP 09/09/2013  Physical Exam  Nursing note and vitals reviewed. Constitutional: She is oriented to person, place, and time. She appears well-developed and well-nourished. No distress.  HENT:  Head: Normocephalic and atraumatic.  Mouth/Throat: Oropharynx is clear and moist.  Eyes: Pupils are equal, round, and reactive to light.  Neck: Normal range of motion. Neck supple.  Cardiovascular: Normal rate, regular rhythm and normal heart sounds.  Exam reveals no gallop and no friction rub.   No murmur heard. Pulmonary/Chest: Effort normal and breath sounds normal.  Neurological: She  is alert and oriented to person, place, and time. She exhibits normal muscle tone. Coordination normal.  Skin: Skin is warm and dry. No rash noted. No erythema.    ED Course  Procedures (including critical care time)  COORDINATION OF CARE: 2:23 PM- Discussed treatment plan with patient at bedside and patient agreed to plan.   Labs Review Labs Reviewed  CBC - Abnormal; Notable for the following:    WBC 10.7 (*)    Hemoglobin 11.7 (*)    HCT 34.6 (*)    All other components within normal limits  COMPREHENSIVE METABOLIC PANEL  ETHANOL  URINE RAPID DRUG SCREEN (HOSP PERFORMED)  POC URINE PREG, ED   The patient is requesting opiate and benzo detox  I personally  performed the services described in this documentation, which was scribed in my presence. The recorded information has been reviewed and is accurate.    Carlyle Dolly, PA-C 10/07/13 1939

## 2013-10-07 NOTE — Discharge Instructions (Signed)
Polysubstance Abuse Proceed directly to [facility]. Return to the ED if you develop new or worsening symptoms. When people abuse more than one drug or type of drug it is called polysubstance or polydrug abuse. For example, many smokers also drink alcohol. This is one form of polydrug abuse. Polydrug abuse also refers to the use of a drug to counteract an unpleasant effect produced by another drug. It may also be used to help with withdrawal from another drug. People who take stimulants may become agitated. Sometimes this agitation is countered with a tranquilizer. This helps protect against the unpleasant side effects. Polydrug abuse also refers to the use of different drugs at the same time.  Anytime drug use is interfering with normal living activities, it has become abuse. This includes problems with family and friends. Psychological dependence has developed when your mind tells you that the drug is needed. This is usually followed by physical dependence which has developed when continuing increases of drug are required to get the same feeling or "high". This is known as addiction or chemical dependency. A person's risk is much higher if there is a history of chemical dependency in the family. SIGNS OF CHEMICAL DEPENDENCY  You have been told by friends or family that drugs have become a problem.  You fight when using drugs.  You are having blackouts (not remembering what you do while using).  You feel sick from using drugs but continue using.  You lie about use or amounts of drugs (chemicals) used.  You need chemicals to get you going.  You are suffering in work performance or in school because of drug use.  You get sick from use of drugs but continue to use anyway.  You need drugs to relate to people or feel comfortable in social situations.  You use drugs to forget problems. "Yes" answered to any of the above signs of chemical dependency indicates there are problems. The longer the use of  drugs continues, the greater the problems will become. If there is a family history of drug or alcohol use, it is best not to experiment with these drugs. Continual use leads to tolerance. After tolerance develops more of the drug is needed to get the same feeling. This is followed by addiction. With addiction, drugs become the most important part of life. It becomes more important to take drugs than participate in the other usual activities of life. This includes relating to friends and family. Addiction is followed by dependency. Dependency is a condition where drugs are now needed not just to get high, but to feel normal. Addiction cannot be cured but it can be stopped. This often requires outside help and the care of professionals. Treatment centers are listed in the yellow pages under: Cocaine, Narcotics, and Alcoholics Anonymous. Most hospitals and clinics can refer you to a specialized care center. Talk to your caregiver if you need help. Document Released: 09/21/2004 Document Revised: 04/24/2011 Document Reviewed: 01/30/2005 [facility] Patient Information 2015 [place], [place]. This information is not intended to replace advice given to you by your health care provider. Make sure you discuss any questions you have with your health care provider.
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
## 2013-10-07 NOTE — ED Notes (Signed)
Pt alert, arrives from home, c/o detox from xanax, cocaine, and prescription narcotics, resp even unlabored, skin pwd, denies SI/HI

## 2013-10-07 NOTE — BH Assessment (Signed)
[facility] Assessment Progress Note      Attempted to assess pt, but she was too drowsy to talk.  Could not keep her eyes open.  Could not complete an answer to a question. Will continue to follow.  EDRN notified.

## 2013-10-07 NOTE — ED Notes (Signed)
Pt resting will continue to monitor, Estill Dooms, RN 9:20 PM 10/07/2013

## 2013-10-07 NOTE — ED Notes (Signed)
Pt alert x 4, v/s stable, report given to [facility] at [employer] . [facility] [employer] called will transportation will continue to monitor. Estill Dooms, RN 10/07/2013 10:30 PM

## 2013-10-11 NOTE — ED Provider Notes (Signed)
Medical screening examination/treatment/procedure(s) were performed by non-physician practitioner and as supervising physician I was immediately available for consultation/collaboration.   EKG Interpretation None        Matthew Gentry, MD 10/11/13 0843 

## 2013-10-15 ENCOUNTER — Ambulatory Visit (INDEPENDENT_AMBULATORY_CARE_PROVIDER_SITE_OTHER): Payer: Medicaid Other | Admitting: Family Medicine

## 2013-10-15 VITALS — BP 131/90 | HR 101 | Temp 97.7°F | Ht 67.0 in | Wt 166.0 lb

## 2013-10-15 DIAGNOSIS — F411 Generalized anxiety disorder: Secondary | ICD-10-CM

## 2013-10-15 DIAGNOSIS — G4700 Insomnia, unspecified: Secondary | ICD-10-CM

## 2013-10-15 MED ORDER — CLONIDINE HCL 0.1 MG PO TABS: ORAL_TABLET | ORAL | Status: DC

## 2013-10-15 NOTE — Progress Notes (Signed)
   Subjective:    Patient ID: [name] [name], female    DOB: 06-Nov-1980, 33 y.o.   MRN: [id number]  HPI This 33 y.o. female presents for evaluation of depression and anxiety.  She states she was on suicide Watch and she has major depression and anxiety.  She has hx of benzo abuse.  She states she is having difficulty with insomnia and tremors and she is having severe anxiety.  She was seen in the ED in the ED 10/07/13. She had positive opiates, benzos, and cocaine in her UDS.  She is requesting meds for anxiety.   Review of Systems No chest pain, SOB, HA, dizziness, vision change, N/V, diarrhea, constipation, dysuria, urinary urgency or frequency, myalgias, arthralgias or rash.     Objective:   Physical Exam Vital signs noted  Well developed well nourished female.  HEENT - Head atraumatic Normocephalic                Eyes - PERRLA, Conjuctiva - clear Sclera- Clear EOMI                Ears - EAC's Wnl TM's Wnl Gross Hearing WNL                Throat - oropharanx wnl Respiratory - Lungs CTA bilateral Cardiac - RRR S1 and S2 without murmur GI - Abdomen soft Nontender and bowel sounds active x 4 Extremities - No edema. Neuro - Grossly intact.       Assessment & Plan:  Insomnia - Plan: cloNIDine (CATAPRES) 0.1 MG tablet  Anxiety state, unspecified - Plan: cloNIDine (CATAPRES) 0.1 MG tablet  Follow up with Behavioral Health at [facility]  Deatra Canter FNP

## 2013-10-30 ENCOUNTER — Telehealth: Payer: Self-pay | Admitting: Family Medicine

## 2013-10-30 ENCOUNTER — Other Ambulatory Visit: Payer: Self-pay | Admitting: *Deleted

## 2013-10-30 MED ORDER — SERTRALINE HCL 100 MG PO TABS: 150.0000 mg | ORAL_TABLET | Freq: Every day | ORAL | Status: DC

## 2013-10-30 MED ORDER — TRAZODONE HCL 100 MG PO TABS: 100.0000 mg | ORAL_TABLET | Freq: Every day | ORAL | Status: DC

## 2013-10-30 NOTE — Telephone Encounter (Signed)
Seen on 9/2- refilled meds

## 2013-10-30 NOTE — Telephone Encounter (Signed)
Done in refill encounter

## 2013-10-31 ENCOUNTER — Encounter (HOSPITAL_COMMUNITY): Payer: Self-pay | Admitting: Emergency Medicine

## 2013-10-31 ENCOUNTER — Emergency Department (HOSPITAL_COMMUNITY)
Admission: EM | Admit: 2013-10-31 | Discharge: 2013-10-31 | Disposition: A | Discharge status: Home / Self Care | Payer: MEDICAID | Attending: Emergency Medicine | Admitting: Emergency Medicine

## 2013-10-31 DIAGNOSIS — Z79899 Other long term (current) drug therapy: Secondary | ICD-10-CM | POA: Diagnosis not present

## 2013-10-31 DIAGNOSIS — F3289 Other specified depressive episodes: Secondary | ICD-10-CM | POA: Diagnosis not present

## 2013-10-31 DIAGNOSIS — F1310 Sedative, hypnotic or anxiolytic abuse, uncomplicated: Secondary | ICD-10-CM | POA: Insufficient documentation

## 2013-10-31 DIAGNOSIS — Z8639 Personal history of other endocrine, nutritional and metabolic disease: Secondary | ICD-10-CM | POA: Insufficient documentation

## 2013-10-31 DIAGNOSIS — F329 Major depressive disorder, single episode, unspecified: Secondary | ICD-10-CM | POA: Diagnosis not present

## 2013-10-31 DIAGNOSIS — F17200 Nicotine dependence, unspecified, uncomplicated: Secondary | ICD-10-CM | POA: Insufficient documentation

## 2013-10-31 DIAGNOSIS — F411 Generalized anxiety disorder: Secondary | ICD-10-CM | POA: Insufficient documentation

## 2013-10-31 DIAGNOSIS — Z862 Personal history of diseases of the blood and blood-forming organs and certain disorders involving the immune mechanism: Secondary | ICD-10-CM | POA: Diagnosis not present

## 2013-10-31 DIAGNOSIS — F1410 Cocaine abuse, uncomplicated: Secondary | ICD-10-CM | POA: Insufficient documentation

## 2013-10-31 DIAGNOSIS — Z046 Encounter for general psychiatric examination, requested by authority: Secondary | ICD-10-CM | POA: Insufficient documentation

## 2013-10-31 DIAGNOSIS — F1910 Other psychoactive substance abuse, uncomplicated: Secondary | ICD-10-CM

## 2013-10-31 LAB — CBC
HCT: 33.4 % — ABNORMAL LOW (ref 36.0–46.0)
Hemoglobin: 11 g/dL — ABNORMAL LOW (ref 12.0–15.0)
MCH: 28.6 pg (ref 26.0–34.0)
MCHC: 32.9 g/dL (ref 30.0–36.0)
MCV: 87 fL (ref 78.0–100.0)
PLATELETS: 201 10*3/uL (ref 150–400)
RBC: 3.84 MIL/uL — ABNORMAL LOW (ref 3.87–5.11)
RDW: 14.5 % (ref 11.5–15.5)
WBC: 4.3 10*3/uL (ref 4.0–10.5)

## 2013-10-31 LAB — COMPREHENSIVE METABOLIC PANEL
ALK PHOS: 68 U/L (ref 39–117)
ALT: 10 U/L (ref 0–35)
ANION GAP: 10 (ref 5–15)
AST: 14 U/L (ref 0–37)
Albumin: 3.4 g/dL — ABNORMAL LOW (ref 3.5–5.2)
BUN: 15 mg/dL (ref 6–23)
CO2: 27 meq/L (ref 19–32)
Calcium: 9 mg/dL (ref 8.4–10.5)
Chloride: 103 mEq/L (ref 96–112)
Creatinine, Ser: 0.79 mg/dL (ref 0.50–1.10)
Glucose, Bld: 102 mg/dL — ABNORMAL HIGH (ref 70–99)
POTASSIUM: 4.1 meq/L (ref 3.7–5.3)
SODIUM: 140 meq/L (ref 137–147)
Total Protein: 7 g/dL (ref 6.0–8.3)

## 2013-10-31 LAB — ETHANOL: Alcohol, Ethyl (B): <11 mg/dL (ref 0–11)

## 2013-10-31 LAB — RAPID URINE DRUG SCREEN, HOSP PERFORMED
Amphetamines: NOT DETECTED
Barbiturates: NOT DETECTED
Benzodiazepines: POSITIVE — AB
COCAINE: POSITIVE — AB
Opiates: NOT DETECTED
TETRAHYDROCANNABINOL: NOT DETECTED

## 2013-10-31 NOTE — ED Notes (Signed)
Patient being discharged. Patient left without signing discharge.

## 2013-10-31 NOTE — ED Notes (Addendum)
Pt reports sent from [facility] for detox from xanax. Pt reports "i dont know why they sent me here." pt reports takes 3 xanax a day. Pt denies taking any today. nad noted.

## 2013-10-31 NOTE — ED Notes (Signed)
[facility] contacted again per physician request. No answer in the admission department. Message left.

## 2013-10-31 NOTE — ED Provider Notes (Signed)
CSN: [id number]     Arrival date & time 10/31/13  1451 History   First MD Initiated Contact with Patient 10/31/13 1503     Chief Complaint  Patient presents with  . Medical Clearance     (Consider location/radiation/quality/duration/timing/severity/associated sxs/prior Treatment) HPI Comments: D/w [facility] counselor - yesterday had eval for assesment for detox.  She agreed and went in today - they did paperwork and sent her here to the ER, she had application for [facility]. She states that her parole officer is forcing her to go into some kind of treatment program though she specifically denies wanting to have help with substance abuse and does not believe she has a problem with benzodiazepines. Of note the patient has been seen in the emergency department several times most recently in the last 3 weeks when she had been abusing benzodiazepines at that time. She denies alcohol use but does endorse using cocaine. She has no physical complaints at this time. Her use has been chronic since the death of a child over 10 years ago. Denies suicidal thoughts, hallucinations or depression.    The history is provided by the patient.    Past Medical History  Diagnosis Date  . Hyperlipidemia   . Depression   . Anxiety    Past Surgical History  Procedure Laterality Date  . Cesarean section    . Tubal ligation     Family History  Problem Relation Age of Onset  . Hypertension Father   . Cancer Paternal Aunt     BREAST  . COPD Paternal Grandfather   . Hyperlipidemia Paternal Grandfather   . Hypertension Paternal Grandfather    History  Substance Use Topics  . Smoking status: Current Some Day Smoker -- 1.00 packs/day    Types: Cigarettes  . Smokeless tobacco: Not on file  . Alcohol Use: No   OB History   Grav Para Term Preterm Abortions TAB SAB Ect Mult Living                 Review of Systems  All other systems reviewed and are negative.     Allergies  Review  of patient's allergies indicates no known allergies.  Home Medications   Prior to Admission medications   Medication Sig Start Date End Date Taking? Authorizing Provider  cloNIDine (CATAPRES) 0.1 MG tablet Take 0.1 mg by mouth daily as needed (for insomnia).   Yes Historical Provider, MD  diazepam (VALIUM) 5 MG tablet Take 1 tablet (5 mg total) by mouth 2 (two) times daily. 07/14/13  Yes Trevor Mace, PA-C  HYDROcodone-acetaminophen (NORCO/VICODIN) 5-325 MG per tablet Take 1-2 tablets by mouth every 4 (four) hours as needed. 07/14/13  Yes Trevor Mace, PA-C  sertraline (ZOLOFT) 100 MG tablet Take 1.5 tablets (150 mg total) by mouth daily. 10/30/13  Yes Deatra Canter, FNP  topiramate (TOPAMAX) 50 MG tablet Take 50 mg by mouth daily as needed (for migraines).   Yes Historical Provider, MD  traZODone (DESYREL) 100 MG tablet Take 1 tablet (100 mg total) by mouth at bedtime. 10/30/13  Yes Deatra Canter, FNP   BP 117/87  Pulse 76  Temp(Src) 98.3 F (36.8 C) (Oral)  Resp 16  Ht  (1.702 m)  Wt 173 lb (78.472 kg)  BMI 27.09 kg/m2  SpO2 100%  LMP 10/31/2013 Physical Exam  Nursing note and vitals reviewed. Constitutional: She appears well-developed and well-nourished. No distress.  HENT:  Head: Normocephalic and atraumatic.  Mouth/Throat: Oropharynx is clear and moist. No oropharyngeal exudate.  Eyes: Conjunctivae and EOM are normal. Pupils are equal, round, and reactive to light. Right eye exhibits no discharge. Left eye exhibits no discharge. No scleral icterus.  Neck: Normal range of motion. Neck supple. No JVD present. No thyromegaly present.  Cardiovascular: Normal rate, regular rhythm, normal heart sounds and intact distal pulses.  Exam reveals no gallop and no friction rub.   No murmur heard. Pulmonary/Chest: Effort normal and breath sounds normal. No respiratory distress. She has no wheezes. She has no rales.  Abdominal: Soft. Bowel sounds are normal. She exhibits no  distension and no mass. There is no tenderness.  Musculoskeletal: Normal range of motion. She exhibits no edema and no tenderness.  Lymphadenopathy:    She has no cervical adenopathy.  Neurological: She is alert. Coordination normal.  Skin: Skin is warm and dry. No rash noted. No erythema.  Psychiatric: She has a normal mood and affect. Her behavior is normal.  Normal mood and affect    ED Course  Procedures (including critical care time) Labs Review Labs Reviewed  URINE RAPID DRUG SCREEN (HOSP PERFORMED) - Abnormal; Notable for the following:    Cocaine POSITIVE (*)    Benzodiazepines POSITIVE (*)    All other components within normal limits  COMPREHENSIVE METABOLIC PANEL - Abnormal; Notable for the following:    Glucose, Bld 102 (*)    Albumin 3.4 (*)    Total Bilirubin <0.2 (*)    All other components within normal limits  CBC - Abnormal; Notable for the following:    RBC 3.84 (*)    Hemoglobin 11.0 (*)    HCT 33.4 (*)    All other components within normal limits  ETHANOL    Imaging Review No results found.   MDM   Final diagnoses:  Substance abuse    The patient's vital signs are normal, laboratory workup will be ordered, the patient does not have a guarantee bed at [facility], she has been told that if this bed was not available for her today she could go back to [facility] Monday and they would refer her to another facility.  [facility] never called back, the patient has requested to be discharged, as no emergency medical condition exists, she has been ambulating without difficulty and does not appear to be in benzodiazepine withdrawal.  Vida Roller, MD 10/31/13 1902

## 2014-01-08 ENCOUNTER — Emergency Department (HOSPITAL_COMMUNITY): Payer: Self-pay

## 2014-01-08 ENCOUNTER — Encounter (HOSPITAL_COMMUNITY): Payer: Self-pay | Admitting: Emergency Medicine

## 2014-01-08 ENCOUNTER — Emergency Department (HOSPITAL_COMMUNITY)
Admission: EM | Admit: 2014-01-08 | Discharge: 2014-01-08 | Disposition: A | Discharge status: Home / Self Care | Payer: Self-pay | Attending: Emergency Medicine | Admitting: Emergency Medicine

## 2014-01-08 DIAGNOSIS — Y998 Other external cause status: Secondary | ICD-10-CM | POA: Insufficient documentation

## 2014-01-08 DIAGNOSIS — E785 Hyperlipidemia, unspecified: Secondary | ICD-10-CM | POA: Insufficient documentation

## 2014-01-08 DIAGNOSIS — Z720 Tobacco use: Secondary | ICD-10-CM | POA: Insufficient documentation

## 2014-01-08 DIAGNOSIS — S1083XA Contusion of other specified part of neck, initial encounter: Secondary | ICD-10-CM | POA: Insufficient documentation

## 2014-01-08 DIAGNOSIS — M5489 Other dorsalgia: Secondary | ICD-10-CM

## 2014-01-08 DIAGNOSIS — Z3202 Encounter for pregnancy test, result negative: Secondary | ICD-10-CM | POA: Insufficient documentation

## 2014-01-08 DIAGNOSIS — F329 Major depressive disorder, single episode, unspecified: Secondary | ICD-10-CM | POA: Insufficient documentation

## 2014-01-08 DIAGNOSIS — W19XXXA Unspecified fall, initial encounter: Secondary | ICD-10-CM

## 2014-01-08 DIAGNOSIS — R609 Edema, unspecified: Secondary | ICD-10-CM

## 2014-01-08 DIAGNOSIS — Z79899 Other long term (current) drug therapy: Secondary | ICD-10-CM | POA: Insufficient documentation

## 2014-01-08 DIAGNOSIS — Y9389 Activity, other specified: Secondary | ICD-10-CM | POA: Insufficient documentation

## 2014-01-08 DIAGNOSIS — T148XXA Other injury of unspecified body region, initial encounter: Secondary | ICD-10-CM

## 2014-01-08 DIAGNOSIS — Y92017 Garden or yard in single-family (private) house as the place of occurrence of the external cause: Secondary | ICD-10-CM | POA: Insufficient documentation

## 2014-01-08 DIAGNOSIS — S6992XA Unspecified injury of left wrist, hand and finger(s), initial encounter: Secondary | ICD-10-CM | POA: Insufficient documentation

## 2014-01-08 DIAGNOSIS — F419 Anxiety disorder, unspecified: Secondary | ICD-10-CM | POA: Insufficient documentation

## 2014-01-08 DIAGNOSIS — S29002A Unspecified injury of muscle and tendon of back wall of thorax, initial encounter: Secondary | ICD-10-CM | POA: Insufficient documentation

## 2014-01-08 HISTORY — DX: Other psychoactive substance abuse, uncomplicated: F19.10

## 2014-01-08 LAB — URINALYSIS, ROUTINE W REFLEX MICROSCOPIC
Bilirubin Urine: NEGATIVE
Glucose, UA: NEGATIVE mg/dL
Hgb urine dipstick: NEGATIVE
Ketones, ur: NEGATIVE mg/dL
LEUKOCYTES UA: NEGATIVE
Nitrite: NEGATIVE
PROTEIN: NEGATIVE mg/dL
Specific Gravity, Urine: 1.025 (ref 1.005–1.030)
UROBILINOGEN UA: 0.2 mg/dL (ref 0.0–1.0)
pH: 6.5 (ref 5.0–8.0)

## 2014-01-08 LAB — PREGNANCY, URINE: Preg Test, Ur: NEGATIVE

## 2014-01-08 MED ORDER — NAPROXEN 500 MG PO TABS: 500.0000 mg | ORAL_TABLET | Freq: Two times a day (BID) | ORAL | Status: DC

## 2014-01-08 MED ORDER — CYCLOBENZAPRINE HCL 5 MG PO TABS: 5.0000 mg | ORAL_TABLET | Freq: Three times a day (TID) | ORAL | Status: DC | PRN

## 2014-01-08 MED ORDER — KETOROLAC TROMETHAMINE 60 MG/2ML IM SOLN
60.0000 mg | Freq: Once | INTRAMUSCULAR | Status: AC
  Administered 2014-01-08: 60 mg via INTRAMUSCULAR
  Filled 2014-01-08: qty 2
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
## 2014-01-08 NOTE — Discharge Instructions (Signed)
Ice packs to your injured areas. Take the medications as prescribed. Recheck by your doctor if not improving in the next week.

## 2015-04-11 ENCOUNTER — Encounter (HOSPITAL_COMMUNITY): Payer: Self-pay | Admitting: Emergency Medicine

## 2015-04-11 ENCOUNTER — Emergency Department (HOSPITAL_COMMUNITY)
Admission: EM | Admit: 2015-04-11 | Discharge: 2015-04-12 | Disposition: A | Discharge status: Home / Self Care | Payer: Medicaid Other | Attending: Emergency Medicine | Admitting: Emergency Medicine

## 2015-04-11 DIAGNOSIS — Z79899 Other long term (current) drug therapy: Secondary | ICD-10-CM | POA: Insufficient documentation

## 2015-04-11 DIAGNOSIS — Z87891 Personal history of nicotine dependence: Secondary | ICD-10-CM | POA: Insufficient documentation

## 2015-04-11 DIAGNOSIS — Z8639 Personal history of other endocrine, nutritional and metabolic disease: Secondary | ICD-10-CM | POA: Insufficient documentation

## 2015-04-11 DIAGNOSIS — F419 Anxiety disorder, unspecified: Secondary | ICD-10-CM | POA: Insufficient documentation

## 2015-04-11 DIAGNOSIS — M5441 Lumbago with sciatica, right side: Secondary | ICD-10-CM

## 2015-04-11 DIAGNOSIS — F329 Major depressive disorder, single episode, unspecified: Secondary | ICD-10-CM | POA: Insufficient documentation

## 2015-04-11 MED ORDER — CYCLOBENZAPRINE HCL 10 MG PO TABS: 10.0000 mg | ORAL_TABLET | Freq: Two times a day (BID) | ORAL | Status: DC | PRN

## 2015-04-11 MED ORDER — KETOROLAC TROMETHAMINE 60 MG/2ML IM SOLN
60.0000 mg | Freq: Once | INTRAMUSCULAR | Status: AC
  Administered 2015-04-11: 60 mg via INTRAMUSCULAR
  Filled 2015-04-11: qty 2

## 2015-04-11 MED ORDER — PREDNISONE 10 MG PO TABS: 20.0000 mg | ORAL_TABLET | Freq: Two times a day (BID) | ORAL | Status: DC

## 2015-04-11 MED ORDER — HYDROCODONE-ACETAMINOPHEN 5-325 MG PO TABS: 1.0000 | ORAL_TABLET | ORAL | Status: DC | PRN

## 2015-04-11 MED ORDER — CYCLOBENZAPRINE HCL 10 MG PO TABS
10.0000 mg | ORAL_TABLET | Freq: Once | ORAL | Status: AC
  Administered 2015-04-11: 10 mg via ORAL
  Filled 2015-04-11: qty 1

## 2015-04-11 MED ORDER — OXYCODONE-ACETAMINOPHEN 5-325 MG PO TABS
1.0000 | ORAL_TABLET | Freq: Once | ORAL | Status: AC
  Administered 2015-04-11: 1 via ORAL
  Filled 2015-04-11: qty 1

## 2015-04-11 NOTE — ED Notes (Signed)
Pt c/o lower back pain for 3-4 days. States she already has bulging discs.

## 2015-04-11 NOTE — ED Provider Notes (Signed)
CSN: [id number]     Arrival date & time 04/11/15  2206 History   First MD Initiated Contact with Patient 04/11/15 2224     Chief Complaint  Patient presents with  . Back Pain     (Consider location/radiation/quality/duration/timing/severity/associated sxs/prior Treatment) Patient is a 35 y.o. female presenting with back pain. The history is provided by the patient. No language interpreter was used.  Back Pain Location:  Lumbar spine Quality:  Stabbing Pain severity:  Severe Pain is:  Same all the time Onset quality:  Gradual Duration:  4 days Timing:  Constant Progression:  Worsening Chronicity:  New Relieved by:  Nothing Worsened by:  Ambulation, bending, coughing, movement and twisting Ineffective treatments:  Heating pad, cold packs, ibuprofen and OTC medications Associated symptoms: leg pain   Associated symptoms: no abdominal pain, no bladder incontinence, no bowel incontinence, no dysuria and no weakness    Mychal Durio Dimalanta is a 35 y.o. female who presents to the ED with low back pain. She reports that she has a bulging disc. She saw Dr. Jeral Fruit, neurosurgeon, and injections in her back. She got better and has not had problems for a couple years until 4 days ago. Patient states she has a good job and does not want to miss work.   Past Medical History  Diagnosis Date  . Hyperlipidemia   . Depression   . Anxiety   . Substance abuse     benzos, cocaine, opiates   Past Surgical History  Procedure Laterality Date  . Cesarean section    . Tubal ligation     Family History  Problem Relation Age of Onset  . Hypertension Father   . Cancer Paternal Aunt     BREAST  . COPD Paternal Grandfather   . Hyperlipidemia Paternal Grandfather   . Hypertension Paternal Grandfather    Social History  Substance Use Topics  . Smoking status: Former Smoker -- 1.00 packs/day    Types: Cigarettes  . Smokeless tobacco: None  . Alcohol Use: No   OB History    No data available      Review of Systems  Gastrointestinal: Negative for abdominal pain and bowel incontinence.  Genitourinary: Negative for bladder incontinence and dysuria.  Musculoskeletal: Positive for back pain.  Neurological: Negative for weakness.  all other systems negative    Allergies  Review of patient's allergies indicates no known allergies.  Home Medications   Prior to Admission medications   Medication Sig Start Date End Date Taking? Authorizing Provider  B Complex-Biotin-FA (B COMPLETE PO) Take 1 tablet by mouth daily.   Yes Historical Provider, MD  ibuprofen (ADVIL,MOTRIN) 200 MG tablet Take 1,600 mg by mouth every 6 (six) hours as needed for mild pain.   Yes Historical Provider, MD  cyclobenzaprine (FLEXERIL) 10 MG tablet Take 1 tablet (10 mg total) by mouth 2 (two) times daily as needed for muscle spasms. 04/11/15   Antwuan Eckley Orlene Och, NP  HYDROcodone-acetaminophen (NORCO/VICODIN) 5-325 MG tablet Take 1 tablet by mouth every 4 (four) hours as needed. 04/11/15   Latunya Kissick Orlene Och, NP  predniSONE (DELTASONE) 10 MG tablet Take 2 tablets (20 mg total) by mouth 2 (two) times daily with a meal. 04/11/15   Kanyla Omeara Orlene Och, NP  sertraline (ZOLOFT) 100 MG tablet Take 1.5 tablets (150 mg total) by mouth daily. 10/30/13   Deatra Canter, FNP  traZODone (DESYREL) 100 MG tablet Take 1 tablet (100 mg total) by mouth at bedtime. 10/30/13   Anselm Pancoast  [facility], FNP   BP 133/81 mmHg  Pulse 82  Temp(Src) 97.6 F (36.4 C) (Oral)  Resp 18  Ht  (1.702 m)  Wt 81.647 kg  BMI 28.19 kg/m2  SpO2 100%  LMP 03/29/2015 Physical Exam  Constitutional: She is oriented to person, place, and time. She appears well-developed and well-nourished. No distress.  HENT:  Head: Normocephalic and atraumatic.  Right Ear: Tympanic membrane normal.  Left Ear: Tympanic membrane normal.  Nose: Nose normal.  Mouth/Throat: Uvula is midline, oropharynx is clear and moist and mucous membranes are normal.  Eyes: Conjunctivae and EOM are  normal.  Neck: Normal range of motion. Neck supple.  Cardiovascular: Normal rate and regular rhythm.   Pulmonary/Chest: Effort normal. She has no wheezes. She has no rales.  Abdominal: Soft. Bowel sounds are normal. There is no tenderness.  Musculoskeletal: Normal range of motion.       Lumbar back: She exhibits tenderness, pain and spasm. She exhibits normal pulse.  Neurological: She is alert and oriented to person, place, and time. She has normal strength. No cranial nerve deficit or sensory deficit. Gait normal.  Reflex Scores:      Bicep reflexes are 2+ on the right side and 2+ on the left side.      Brachioradialis reflexes are 2+ on the right side and 2+ on the left side.      Patellar reflexes are 2+ on the right side and 2+ on the left side.      Achilles reflexes are 2+ on the right side and 2+ on the left side. Skin: Skin is warm and dry.  Psychiatric: She has a normal mood and affect. Her behavior is normal.  Nursing note and vitals reviewed.   ED Course  Procedures  MDM  35 y.o. female with hx of bulging disc who has done well for the past few years but 4 day hx of lower back pain that radiates to bilateral buttocks stable for d/c without focal neuro deficits. Will treat for pain and muscle spasm. She will follow up with her neurosurgeon. She will return here as needed for worsening symptoms.   Final diagnoses:  Bilateral low back pain with right-sided sciatica      Janne Napoleon, NP 04/11/15 2354  Devoria Albe, MD 04/12/15 [phone]

## 2015-04-11 NOTE — Discharge Instructions (Signed)
Do not drive while taking the muscle relaxant or narcotic because they will make you sleepy

## 2015-06-05 ENCOUNTER — Emergency Department (HOSPITAL_COMMUNITY)
Admission: EM | Admit: 2015-06-05 | Discharge: 2015-06-06 | Disposition: A | Discharge status: Home / Self Care | Payer: No Typology Code available for payment source | Attending: Emergency Medicine | Admitting: Emergency Medicine

## 2015-06-05 DIAGNOSIS — Y998 Other external cause status: Secondary | ICD-10-CM | POA: Insufficient documentation

## 2015-06-05 DIAGNOSIS — S60511A Abrasion of right hand, initial encounter: Secondary | ICD-10-CM | POA: Diagnosis not present

## 2015-06-05 DIAGNOSIS — Z87891 Personal history of nicotine dependence: Secondary | ICD-10-CM | POA: Insufficient documentation

## 2015-06-05 DIAGNOSIS — S0993XA Unspecified injury of face, initial encounter: Secondary | ICD-10-CM | POA: Diagnosis present

## 2015-06-05 DIAGNOSIS — S70312A Abrasion, left thigh, initial encounter: Secondary | ICD-10-CM | POA: Insufficient documentation

## 2015-06-05 DIAGNOSIS — F419 Anxiety disorder, unspecified: Secondary | ICD-10-CM | POA: Diagnosis not present

## 2015-06-05 DIAGNOSIS — R000 Tachycardia, unspecified: Secondary | ICD-10-CM | POA: Diagnosis not present

## 2015-06-05 DIAGNOSIS — S0181XA Laceration without foreign body of other part of head, initial encounter: Secondary | ICD-10-CM | POA: Diagnosis not present

## 2015-06-05 DIAGNOSIS — M79641 Pain in right hand: Secondary | ICD-10-CM

## 2015-06-05 DIAGNOSIS — Y92410 Unspecified street and highway as the place of occurrence of the external cause: Secondary | ICD-10-CM | POA: Insufficient documentation

## 2015-06-05 DIAGNOSIS — Z8639 Personal history of other endocrine, nutritional and metabolic disease: Secondary | ICD-10-CM | POA: Diagnosis not present

## 2015-06-05 DIAGNOSIS — Y9389 Activity, other specified: Secondary | ICD-10-CM | POA: Insufficient documentation

## 2015-06-05 DIAGNOSIS — F329 Major depressive disorder, single episode, unspecified: Secondary | ICD-10-CM | POA: Diagnosis not present

## 2015-06-05 DIAGNOSIS — T07XXXA Unspecified multiple injuries, initial encounter: Secondary | ICD-10-CM

## 2015-06-05 DIAGNOSIS — T1490XA Injury, unspecified, initial encounter: Secondary | ICD-10-CM
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
## 2015-06-06 ENCOUNTER — Emergency Department (HOSPITAL_COMMUNITY): Payer: No Typology Code available for payment source

## 2015-06-06 ENCOUNTER — Encounter (HOSPITAL_COMMUNITY): Payer: Self-pay

## 2015-06-06 LAB — URINE MICROSCOPIC-ADD ON

## 2015-06-06 LAB — CBC WITH DIFFERENTIAL/PLATELET
BASOS PCT: 0 %
Basophils Absolute: 0 10*3/uL (ref 0.0–0.1)
EOS ABS: 0 10*3/uL (ref 0.0–0.7)
EOS PCT: 0 %
HCT: 34.2 % — ABNORMAL LOW (ref 36.0–46.0)
HEMOGLOBIN: 10.9 g/dL — AB (ref 12.0–15.0)
Lymphocytes Relative: 11 %
Lymphs Abs: 2.2 10*3/uL (ref 0.7–4.0)
MCH: 25.8 pg — ABNORMAL LOW (ref 26.0–34.0)
MCHC: 31.9 g/dL (ref 30.0–36.0)
MCV: 80.9 fL (ref 78.0–100.0)
Monocytes Absolute: 1.3 10*3/uL — ABNORMAL HIGH (ref 0.1–1.0)
Monocytes Relative: 6 %
NEUTROS PCT: 83 %
Neutro Abs: 17 10*3/uL — ABNORMAL HIGH (ref 1.7–7.7)
PLATELETS: 301 10*3/uL (ref 150–400)
RBC: 4.23 MIL/uL (ref 3.87–5.11)
RDW: 15.5 % (ref 11.5–15.5)
WBC: 20.5 10*3/uL — AB (ref 4.0–10.5)

## 2015-06-06 LAB — COMPREHENSIVE METABOLIC PANEL
ALK PHOS: 46 U/L (ref 38–126)
ALT: 14 U/L (ref 14–54)
ANION GAP: 11 (ref 5–15)
AST: 25 U/L (ref 15–41)
Albumin: 3.5 g/dL (ref 3.5–5.0)
BILIRUBIN TOTAL: 0.7 mg/dL (ref 0.3–1.2)
BUN: 8 mg/dL (ref 6–20)
CALCIUM: 8.7 mg/dL — AB (ref 8.9–10.3)
CO2: 20 mmol/L — ABNORMAL LOW (ref 22–32)
CREATININE: 0.71 mg/dL (ref 0.44–1.00)
Chloride: 108 mmol/L (ref 101–111)
Glucose, Bld: 123 mg/dL — ABNORMAL HIGH (ref 65–99)
Potassium: 3.4 mmol/L — ABNORMAL LOW (ref 3.5–5.1)
SODIUM: 139 mmol/L (ref 135–145)
TOTAL PROTEIN: 6.6 g/dL (ref 6.5–8.1)

## 2015-06-06 LAB — URINALYSIS, ROUTINE W REFLEX MICROSCOPIC
Bilirubin Urine: NEGATIVE
Glucose, UA: NEGATIVE mg/dL
Ketones, ur: 15 mg/dL — AB
NITRITE: NEGATIVE
PH: 5 (ref 5.0–8.0)
Protein, ur: NEGATIVE mg/dL
SPECIFIC GRAVITY, URINE: 1.016 (ref 1.005–1.030)

## 2015-06-06 LAB — I-STAT CHEM 8, ED
BUN: 9 mg/dL (ref 6–20)
CREATININE: 0.7 mg/dL (ref 0.44–1.00)
Calcium, Ion: 1.15 mmol/L (ref 1.12–1.23)
Chloride: 105 mmol/L (ref 101–111)
GLUCOSE: 118 mg/dL — AB (ref 65–99)
HEMATOCRIT: 37 % (ref 36.0–46.0)
HEMOGLOBIN: 12.6 g/dL (ref 12.0–15.0)
POTASSIUM: 3.4 mmol/L — AB (ref 3.5–5.1)
Sodium: 142 mmol/L (ref 135–145)
TCO2: 20 mmol/L (ref 0–100)

## 2015-06-06 LAB — LIPASE, BLOOD: Lipase: 26 U/L (ref 11–51)

## 2015-06-06 LAB — I-STAT CG4 LACTIC ACID, ED: LACTIC ACID, VENOUS: 0.92 mmol/L (ref 0.5–2.0)

## 2015-06-06 LAB — I-STAT BETA HCG BLOOD, ED (MC, WL, AP ONLY): I-stat hCG, quantitative: <5 m[IU]/mL (ref <5)

## 2015-06-06 MED ORDER — IOPAMIDOL (ISOVUE-300) INJECTION 61%
INTRAVENOUS | Status: AC
  Administered 2015-06-06: 100 mL
  Filled 2015-06-06: qty 100

## 2015-06-06 MED ORDER — IOPAMIDOL (ISOVUE-300) INJECTION 61%
INTRAVENOUS | Status: AC
  Filled 2015-06-06: qty 100

## 2015-06-06 MED ORDER — HYDROMORPHONE HCL 1 MG/ML IJ SOLN
0.5000 mg | Freq: Once | INTRAMUSCULAR | Status: AC
  Administered 2015-06-06: 0.5 mg via INTRAVENOUS
  Filled 2015-06-06: qty 1

## 2015-06-06 MED ORDER — BACITRACIN ZINC 500 UNIT/GM EX OINT
1.0000 "application " | TOPICAL_OINTMENT | Freq: Two times a day (BID) | CUTANEOUS | Status: DC
  Filled 2015-06-06: qty 0.9

## 2015-06-06 MED ORDER — OXYCODONE-ACETAMINOPHEN 5-325 MG PO TABS
1.0000 | ORAL_TABLET | Freq: Once | ORAL | Status: AC
  Administered 2015-06-06: 1 via ORAL
  Filled 2015-06-06: qty 1

## 2015-06-06 MED ORDER — SODIUM CHLORIDE 0.9 % IV BOLUS (SEPSIS)
1000.0000 mL | Freq: Once | INTRAVENOUS | Status: AC
  Administered 2015-06-06: 1000 mL via INTRAVENOUS

## 2015-06-06 MED ORDER — BACITRACIN ZINC 500 UNIT/GM EX OINT: 1.0000 "application " | TOPICAL_OINTMENT | Freq: Two times a day (BID) | CUTANEOUS | Status: DC

## 2015-06-06 MED ORDER — HYDROCODONE-ACETAMINOPHEN 5-325 MG PO TABS: 2.0000 | ORAL_TABLET | ORAL | Status: DC | PRN

## 2015-06-06 MED ORDER — ONDANSETRON HCL 4 MG/2ML IJ SOLN
4.0000 mg | Freq: Once | INTRAMUSCULAR | Status: AC
  Administered 2015-06-06: 4 mg via INTRAVENOUS
  Filled 2015-06-06: qty 2

## 2015-06-06 NOTE — ED Notes (Signed)
MD at bedside. 

## 2015-06-06 NOTE — ED Notes (Signed)
[facility] gave 8mg  Morphine IV

## 2015-06-06 NOTE — Discharge Instructions (Signed)

## 2015-06-06 NOTE — ED Notes (Signed)
C/o a headache 

## 2015-06-06 NOTE — ED Notes (Signed)
Called family, no answer.

## 2015-06-06 NOTE — ED Notes (Signed)
Pain med given  To c-t

## 2015-06-06 NOTE — ED Notes (Signed)
Family has been called x2 for pt but no one answers.
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
## 2015-06-06 NOTE — ED Notes (Signed)
Phone given to pt to call family 

## 2015-06-07 MED FILL — Medication: Qty: 1 | Status: AC

## 2015-10-14 ENCOUNTER — Ambulatory Visit: Payer: Medicaid Other | Admitting: Advanced Practice Midwife
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
## 2017-06-21 ENCOUNTER — Emergency Department (HOSPITAL_COMMUNITY)
Admission: EM | Admit: 2017-06-21 | Discharge: 2017-06-21 | Disposition: A | Discharge status: Home / Self Care | Payer: Self-pay | Attending: Emergency Medicine | Admitting: Emergency Medicine

## 2017-06-21 ENCOUNTER — Other Ambulatory Visit: Payer: Self-pay

## 2017-06-21 ENCOUNTER — Encounter (HOSPITAL_COMMUNITY): Payer: Self-pay | Admitting: *Deleted

## 2017-06-21 DIAGNOSIS — Z5321 Procedure and treatment not carried out due to patient leaving prior to being seen by health care provider: Secondary | ICD-10-CM | POA: Insufficient documentation

## 2017-06-21 DIAGNOSIS — Z202 Contact with and (suspected) exposure to infections with a predominantly sexual mode of transmission: Secondary | ICD-10-CM | POA: Insufficient documentation

## 2017-06-21 DIAGNOSIS — R300 Dysuria: Secondary | ICD-10-CM | POA: Insufficient documentation

## 2017-06-21 LAB — URINALYSIS, ROUTINE W REFLEX MICROSCOPIC
BILIRUBIN URINE: NEGATIVE
Bacteria, UA: NONE SEEN
GLUCOSE, UA: NEGATIVE mg/dL
KETONES UR: NEGATIVE mg/dL
Nitrite: NEGATIVE
PH: 6 (ref 5.0–8.0)
Protein, ur: NEGATIVE mg/dL
Specific Gravity, Urine: 1.026 (ref 1.005–1.030)

## 2017-06-21 LAB — PREGNANCY, URINE: Preg Test, Ur: NEGATIVE

## 2017-06-21 NOTE — ED Triage Notes (Signed)
Pt states she has had unprotected sex with a partner that was diagnosed with herpes; pt states she has had some pressure with urination

## 2017-08-16 ENCOUNTER — Encounter (HOSPITAL_COMMUNITY): Payer: Self-pay

## 2017-08-16 ENCOUNTER — Inpatient Hospital Stay (HOSPITAL_COMMUNITY)
Admission: EM | Admit: 2017-08-16 | Discharge: 2017-08-18 | DRG: 917 | Disposition: A | Discharge status: Other Acute Inpatient Hospital | Payer: Medicaid Other | Attending: Internal Medicine | Admitting: Internal Medicine

## 2017-08-16 ENCOUNTER — Other Ambulatory Visit: Payer: Self-pay

## 2017-08-16 DIAGNOSIS — F1210 Cannabis abuse, uncomplicated: Secondary | ICD-10-CM | POA: Diagnosis present

## 2017-08-16 DIAGNOSIS — D649 Anemia, unspecified: Secondary | ICD-10-CM | POA: Diagnosis present

## 2017-08-16 DIAGNOSIS — R739 Hyperglycemia, unspecified: Secondary | ICD-10-CM | POA: Diagnosis present

## 2017-08-16 DIAGNOSIS — R9431 Abnormal electrocardiogram [ECG] [EKG]: Secondary | ICD-10-CM

## 2017-08-16 DIAGNOSIS — G9340 Encephalopathy, unspecified: Secondary | ICD-10-CM

## 2017-08-16 DIAGNOSIS — T50902A Poisoning by unspecified drugs, medicaments and biological substances, intentional self-harm, initial encounter: Secondary | ICD-10-CM

## 2017-08-16 DIAGNOSIS — F1410 Cocaine abuse, uncomplicated: Secondary | ICD-10-CM | POA: Diagnosis present

## 2017-08-16 DIAGNOSIS — G92 Toxic encephalopathy: Secondary | ICD-10-CM | POA: Diagnosis present

## 2017-08-16 DIAGNOSIS — I4581 Long QT syndrome: Secondary | ICD-10-CM | POA: Diagnosis present

## 2017-08-16 DIAGNOSIS — F32A Depression, unspecified: Secondary | ICD-10-CM

## 2017-08-16 DIAGNOSIS — E785 Hyperlipidemia, unspecified: Secondary | ICD-10-CM | POA: Diagnosis present

## 2017-08-16 DIAGNOSIS — T43212A Poisoning by selective serotonin and norepinephrine reuptake inhibitors, intentional self-harm, initial encounter: Principal | ICD-10-CM | POA: Diagnosis present

## 2017-08-16 DIAGNOSIS — F1510 Other stimulant abuse, uncomplicated: Secondary | ICD-10-CM | POA: Diagnosis present

## 2017-08-16 DIAGNOSIS — Z87891 Personal history of nicotine dependence: Secondary | ICD-10-CM

## 2017-08-16 DIAGNOSIS — F1910 Other psychoactive substance abuse, uncomplicated: Secondary | ICD-10-CM

## 2017-08-16 DIAGNOSIS — F329 Major depressive disorder, single episode, unspecified: Secondary | ICD-10-CM | POA: Diagnosis present

## 2017-08-16 DIAGNOSIS — F419 Anxiety disorder, unspecified: Secondary | ICD-10-CM

## 2017-08-16 DIAGNOSIS — T50901A Poisoning by unspecified drugs, medicaments and biological substances, accidental (unintentional), initial encounter: Secondary | ICD-10-CM | POA: Diagnosis present

## 2017-08-16 LAB — RETICULOCYTES
RBC.: 4.26 MIL/uL (ref 3.87–5.11)
Retic Count, Absolute: 68.2 10*3/uL (ref 19.0–186.0)
Retic Ct Pct: 1.6 % (ref 0.4–3.1)

## 2017-08-16 LAB — MRSA PCR SCREENING: MRSA by PCR: NEGATIVE

## 2017-08-16 LAB — COMPREHENSIVE METABOLIC PANEL
ALBUMIN: 3.8 g/dL (ref 3.5–5.0)
ALK PHOS: 65 U/L (ref 38–126)
ALT: 11 U/L (ref 0–44)
ANION GAP: 12 (ref 5–15)
AST: 14 U/L — ABNORMAL LOW (ref 15–41)
BILIRUBIN TOTAL: 0.6 mg/dL (ref 0.3–1.2)
BUN: 11 mg/dL (ref 6–20)
CALCIUM: 8.9 mg/dL (ref 8.9–10.3)
CO2: 23 mmol/L (ref 22–32)
Chloride: 104 mmol/L (ref 98–111)
Creatinine, Ser: 0.92 mg/dL (ref 0.44–1.00)
Glucose, Bld: 128 mg/dL — ABNORMAL HIGH (ref 70–99)
Potassium: 3.5 mmol/L (ref 3.5–5.1)
Sodium: 139 mmol/L (ref 135–145)
TOTAL PROTEIN: 8 g/dL (ref 6.5–8.1)

## 2017-08-16 LAB — ACETAMINOPHEN LEVEL

## 2017-08-16 LAB — CBC WITH DIFFERENTIAL/PLATELET
Basophils Absolute: 0 10*3/uL (ref 0.0–0.1)
Basophils Relative: 0 %
Eosinophils Absolute: 0 10*3/uL (ref 0.0–0.7)
Eosinophils Relative: 0 %
HEMATOCRIT: 31.8 % — AB (ref 36.0–46.0)
HEMOGLOBIN: 9.7 g/dL — AB (ref 12.0–15.0)
LYMPHS ABS: 1.3 10*3/uL (ref 0.7–4.0)
LYMPHS PCT: 14 %
MCH: 23.9 pg — AB (ref 26.0–34.0)
MCHC: 30.5 g/dL (ref 30.0–36.0)
MCV: 78.3 fL (ref 78.0–100.0)
MONOS PCT: 6 %
Monocytes Absolute: 0.6 10*3/uL (ref 0.1–1.0)
NEUTROS ABS: 7.3 10*3/uL (ref 1.7–7.7)
NEUTROS PCT: 80 %
Platelets: 390 10*3/uL (ref 150–400)
RBC: 4.06 MIL/uL (ref 3.87–5.11)
RDW: 16.6 % — ABNORMAL HIGH (ref 11.5–15.5)
WBC: 9.2 10*3/uL (ref 4.0–10.5)

## 2017-08-16 LAB — MAGNESIUM
MAGNESIUM: 2 mg/dL (ref 1.7–2.4)
MAGNESIUM: 2 mg/dL (ref 1.7–2.4)

## 2017-08-16 LAB — RAPID URINE DRUG SCREEN, HOSP PERFORMED
Amphetamines: POSITIVE — AB
BENZODIAZEPINES: NOT DETECTED
Cocaine: POSITIVE — AB
Opiates: NOT DETECTED
Tetrahydrocannabinol: POSITIVE — AB

## 2017-08-16 LAB — ETHANOL

## 2017-08-16 LAB — FOLATE: FOLATE: 12.9 ng/mL (ref >5.9)

## 2017-08-16 LAB — SALICYLATE LEVEL

## 2017-08-16 LAB — PREGNANCY, URINE: Preg Test, Ur: NEGATIVE

## 2017-08-16 MED ORDER — MAGNESIUM SULFATE 2 GM/50ML IV SOLN
2.0000 g | INTRAVENOUS | Status: AC
  Administered 2017-08-16: 2 g via INTRAVENOUS
  Filled 2017-08-16: qty 50

## 2017-08-16 MED ORDER — ENOXAPARIN SODIUM 40 MG/0.4ML ~~LOC~~ SOLN
40.0000 mg | SUBCUTANEOUS | Status: DC
  Filled 2017-08-16 (×2): qty 0.4

## 2017-08-16 MED ORDER — SODIUM CHLORIDE 0.9 % IV BOLUS
1000.0000 mL | Freq: Once | INTRAVENOUS | Status: AC
  Administered 2017-08-16: 1000 mL via INTRAVENOUS

## 2017-08-16 MED ORDER — LACTATED RINGERS IV SOLN
INTRAVENOUS | Status: DC
  Administered 2017-08-16 – 2017-08-18 (×4): via INTRAVENOUS

## 2017-08-16 MED ORDER — HYDROGEN PEROXIDE 3 % EX SOLN
CUTANEOUS | Status: AC
  Filled 2017-08-16: qty 473

## 2017-08-16 NOTE — ED Notes (Addendum)
Pt woke up stating that she had to urinate. This NT & RN Zella BallRobin put pt on the bedpan, pt urinated, & we dried pt and changed her gown & bed linen. Pt also asked for something to eat. Pt is now resting comfortably.

## 2017-08-16 NOTE — ED Notes (Signed)
Pt hypotensive. EDP notified. Verbal order for 1L NS given.

## 2017-08-16 NOTE — H&P (Signed)
History and Physical    [name] [name]:[id number] DOB: Mar 27, 1980 DOA: 08/16/2017  PCP: Patient, No Pcp Per   Patient coming from: Home  Chief Complaint: Drug overdose  HPI: [name] [name] is a 37 y.o. female with medical history significant for anxiety/depression, and substance abuse to include narcotics and cocaine who was dropped off at the EMS station by her boyfriend after suspected overdose.  She is excessively sedated and a poor historian, but it appears that she may have taken 20 tablets of 100 mg trazodone.  She has been prescribed this for her anxiety/depression.  She currently denies any intent to harm herself and states that she just wanted to sleep.  There are no other friends or family members at bedside and patient cannot give any other history at this time.   ED Course: Vital signs with some soft blood pressure readings which have now improved after fluid bolus with 2 L.  Laboratory data essentially unremarkable aside from urine drug screen which is notable for cocaine, THC, and amphetamines.  Poison control has been contacted regarding QT interval with recommendations to monitor repeat EKGs every 6 hours with magnesium recommended for correction.  She has already received a 4g dose of magnesium in the ED.  Review of Systems: Cannot be obtained due to patient condition.  Past Medical History:  Diagnosis Date  . Anxiety   . Depression   . Hyperlipidemia   . Substance abuse (HCC)    benzos, cocaine, opiates    Past Surgical History:  Procedure Laterality Date  . CESAREAN SECTION    . TUBAL LIGATION       reports that she has quit smoking. Her smoking use included cigarettes. She smoked 1.00 pack per day. She has never used smokeless tobacco. She reports that she has current or past drug history. Drug: Marijuana. She reports that she does not drink alcohol.  No Known Allergies  Family History  Problem Relation Age of Onset  . Hypertension Father   . Cancer Paternal  Aunt        BREAST  . COPD Paternal Grandfather   . Hyperlipidemia Paternal Grandfather   . Hypertension Paternal Grandfather     Prior to Admission medications   Medication Sig Start Date End Date Taking? Authorizing Provider  HYDROcodone-acetaminophen (NORCO/VICODIN) 5-325 MG tablet Take 2 tablets by mouth every 4 (four) hours as needed. 06/06/15   Little, Ambrose Finland, MD  sertraline (ZOLOFT) 100 MG tablet Take 1.5 tablets (150 mg total) by mouth daily. Patient not taking: Reported on 06/06/2015 10/30/13   Deatra Canter, FNP  traZODone (DESYREL) 100 MG tablet Take 1 tablet (100 mg total) by mouth at bedtime. Patient not taking: Reported on 06/06/2015 10/30/13   Deatra Canter, FNP    Physical Exam: Vitals:   08/16/17 1100 08/16/17 1130 08/16/17 1200 08/16/17 1230  BP: 99/64 (!) 95/58 108/74 102/73  Pulse: 73 70  68  Resp: 14 14 11 13   Temp:      TempSrc:      SpO2: 100% 100%  98%  Weight:        Constitutional: Somnolent Vitals:   08/16/17 1100 08/16/17 1130 08/16/17 1200 08/16/17 1230  BP: 99/64 (!) 95/58 108/74 102/73  Pulse: 73 70  68  Resp: 14 14 11 13   Temp:      TempSrc:      SpO2: 100% 100%  98%  Weight:       Eyes: lids and conjunctivae normal ENMT:  Mucous membranes are moist.  Neck: normal, supple Respiratory: clear to auscultation bilaterally. Normal respiratory effort. No accessory muscle use.  Cardiovascular: Regular rate and rhythm, no murmurs. No extremity edema. Abdomen: no tenderness, no distention. Bowel sounds positive.  Musculoskeletal:  No joint deformity upper and lower extremities.   Skin: no rashes, lesions, ulcers.   Labs on Admission: I have personally reviewed following labs and imaging studies  CBC: Recent Labs  Lab 08/16/17 0804  WBC 9.2  NEUTROABS 7.3  HGB 9.7*  HCT 31.8*  MCV 78.3  PLT 390   Basic Metabolic Panel: Recent Labs  Lab 08/16/17 0804 08/16/17 0806  NA 139  --   K 3.5  --   CL 104  --   CO2 23  --     GLUCOSE 128*  --   BUN 11  --   CREATININE 0.92  --   CALCIUM 8.9  --   MG  --  2.0   GFR: Estimated Creatinine Clearance: 84.5 mL/min (by C-G formula based on SCr of 0.92 mg/dL). Liver Function Tests: Recent Labs  Lab 08/16/17 0804  AST 14*  ALT 11  ALKPHOS 65  BILITOT 0.6  PROT 8.0  ALBUMIN 3.8   No results for input(s): LIPASE, AMYLASE in the last 168 hours. No results for input(s): AMMONIA in the last 168 hours. Coagulation Profile: No results for input(s): INR, PROTIME in the last 168 hours. Cardiac Enzymes: No results for input(s): CKTOTAL, CKMB, CKMBINDEX, TROPONINI in the last 168 hours. BNP (last 3 results) No results for input(s): PROBNP in the last 8760 hours. HbA1C: No results for input(s): HGBA1C in the last 72 hours. CBG: No results for input(s): GLUCAP in the last 168 hours. Lipid Profile: No results for input(s): CHOL, HDL, LDLCALC, TRIG, CHOLHDL, LDLDIRECT in the last 72 hours. Thyroid Function Tests: No results for input(s): TSH, T4TOTAL, FREET4, T3FREE, THYROIDAB in the last 72 hours. Anemia Panel: No results for input(s): VITAMINB12, FOLATE, FERRITIN, TIBC, IRON, RETICCTPCT in the last 72 hours. Urine analysis:    Component Value Date/Time   COLORURINE YELLOW 06/21/2017 1420   APPEARANCEUR HAZY (A) 06/21/2017 1420   LABSPEC 1.026 06/21/2017 1420   PHURINE 6.0 06/21/2017 1420   GLUCOSEU NEGATIVE 06/21/2017 1420   HGBUR SMALL (A) 06/21/2017 1420   BILIRUBINUR NEGATIVE 06/21/2017 1420   BILIRUBINUR neg 12/23/2012 1640   KETONESUR NEGATIVE 06/21/2017 1420   PROTEINUR NEGATIVE 06/21/2017 1420   UROBILINOGEN 0.2 01/08/2014 2016   NITRITE NEGATIVE 06/21/2017 1420   LEUKOCYTESUR SMALL (A) 06/21/2017 1420    Radiological Exams on Admission: No results found.  EKG: Independently reviewed. SR 79bpm; QTc  Assessment/Plan Principal Problem:   Encephalopathy acute Active Problems:   Anxiety and depression   Overdose, accidental or  unintentional, initial encounter   QT prolongation   Substance abuse (HCC)    1. Acute toxic encephalopathy secondary to drug overdose.  Unclear whether patient was trying to harm herself, but this does not appear at all unintentional.  Will order TTS evaluation by a.m. when she is awake and alert.  Observed in stepdown unit overnight on telemetry.  Plan to continue on IV fluid and avoid QT prolonging agents.  Check acetaminophen level.  Repeat BMP and magnesium and supplement to high-end of normal with repeat EKG after supplementation has been performed. 2. Normocytic anemia.  Anemia panel. 3. Anxiety/depression.  Hold home medications. 4. Substance abuse.  Will require counseling on cessation.   DVT prophylaxis: Lovenox Code Status: Full  Family Communication: None at bedside Disposition Plan:Observe for improvement; detox Consults called:None Admission status: Obs, SDU   Casey Maxfield D Sherryll BurgerShah DO [facility] Pager [phone]  If 7PM-7AM, please contact night-coverage [email or web address] Password [email or web address]  08/16/2017, 1:13 PM

## 2017-08-16 NOTE — ED Notes (Addendum)
[facility]: CMP, Mag level, tylenol, asa, Repeat EKG 1 hour,cardiac monitor, monitor for hypotension and bradycardia, IV fluids for hyptotension. Monitor for 6 hrs or until back to baseline. Dr Hyacinth MeekerMiller aware

## 2017-08-16 NOTE — ED Triage Notes (Addendum)
Pt brought in by EMS. Reported that pt was found by boyfriend and he had take approximately 20 trazadone at 6 am Pt had vomit on her shirt and incontinent of Bowel and bladder. Pt denies SI. Pulled IV out en route. Pt is arousable, but lethargic and able to answer questions

## 2017-08-16 NOTE — ED Provider Notes (Addendum)
[facility] EMERGENCY DEPARTMENT Provider Note   CSN: [id number] Arrival date & time: 08/16/17  4696     History   Chief Complaint Chief Complaint  Patient presents with  . Drug Overdose    HPI [name] [name] is a 37 y.o. female.  HPI  37 y/o female - with hx of anxiety but she also has a history of substance abuse with benzodiazepines, history of cocaine and opiates.  She is known to have some depression.  Patient is unable to give me an adequate history as she is excessively sedated after reportedly taking 20 tablets of 100 mg trazodone.  It is unclear exactly when this happened, when I try to ask the patient when it happens she said she did not take it all at once but cannot give me any more clarity.  The boyfriend who reportedly dropped her off at the paramedic station is not here to give any further history either.  Level 5 caveat applies secondary to altered mental status.  There was report of an episode of vomiting prior to the hospital arrival as well as ripping out her IV in the ambulance.  When asked if she was try to kill herself she mumbles no  Past Medical History:  Diagnosis Date  . Anxiety   . Depression   . Hyperlipidemia   . Substance abuse (HCC)    benzos, cocaine, opiates    Patient Active Problem List   Diagnosis Date Noted  . Stress and adjustment reaction 02/17/2013  . SOB (shortness of breath) 02/17/2013  . Panic anxiety syndrome 02/17/2013  . 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 CESAREAN SECTION    . TUBAL LIGATION       OB History   None      Home Medications    Prior to Admission medications   Medication Sig Start Date End Date Taking? Authorizing Provider  HYDROcodone-acetaminophen (NORCO/VICODIN) 5-325 MG tablet Take 2 tablets by mouth every 4 (four) hours as needed. 06/06/15   Little, Ambrose Finland, MD  sertraline (ZOLOFT) 100 MG tablet Take 1.5 tablets  (150 mg total) by mouth daily. Patient not taking: Reported on 06/06/2015 10/30/13   Deatra Canter, FNP  traZODone (DESYREL) 100 MG tablet Take 1 tablet (100 mg total) by mouth at bedtime. Patient not taking: Reported on 06/06/2015 10/30/13   Deatra Canter, FNP    Family History Family History  Problem Relation Age of Onset  . Hypertension Father   . Cancer Paternal Aunt        BREAST  . COPD Paternal Grandfather   . Hyperlipidemia Paternal Grandfather   . Hypertension Paternal Grandfather     Social History Social History   Tobacco Use  . Smoking status: Former Smoker    Packs/day: 1.00    Types: Cigarettes  . Smokeless tobacco: Never Used  Substance Use Topics  . Alcohol use: No  . Drug use: Yes    Types: Marijuana     Allergies   Patient has no known allergies.   Review of Systems Review of Systems  Unable to perform ROS: Mental status change     Physical Exam Updated Vital Signs BP 102/73   Pulse 68   Temp (!) 97.5 F (36.4 C) (Oral)   Resp 13   Wt 72.6 kg (160 lb)   SpO2 98%   BMI 24.33 kg/m   Physical Exam  Constitutional: She  appears well-developed and well-nourished. She appears distressed.  HENT:  Head: Normocephalic and atraumatic.  Mouth/Throat: No oropharyngeal exudate.   membranes are dry, evidence of vomitus around the mouth and nose  Eyes: Pupils are equal, round, and reactive to light. Conjunctivae and EOM are normal. Right eye exhibits no discharge. Left eye exhibits no discharge. No scleral icterus.  Neck: Normal range of motion. Neck supple. No JVD present. No thyromegaly present.  Cardiovascular: Normal rate, regular rhythm, normal heart sounds and intact distal pulses. Exam reveals no gallop and no friction rub.  No murmur heard. Pulmonary/Chest: Effort normal and breath sounds normal. No respiratory distress. She has no wheezes. She has no rales.  Abdominal: Soft. Bowel sounds are normal. She exhibits no distension and no  mass. There is no tenderness.  Musculoskeletal: Normal range of motion. She exhibits no edema or tenderness.  Lymphadenopathy:    She has no cervical adenopathy.  Neurological:  The patient is slurring her words, she is excessively sedated, she is able to wake up to loud voice or painful stimuli, she is able to move all 4 extremities but is weak, discoordinated and falls asleep very quickly.  She has a disconjugate gaze until she wakes up when she has conjugate gaze.  There is no nystagmus, no facial droop  Skin: Skin is warm and dry. No rash noted. No erythema.  Psychiatric: She has a normal mood and affect. Her behavior is normal.  Nursing note and vitals reviewed.    ED Treatments / Results  Labs (all labs ordered are listed, but only abnormal results are displayed) Labs Reviewed  CBC WITH DIFFERENTIAL/PLATELET - Abnormal; Notable for the following components:      Result Value   Hemoglobin 9.7 (*)    HCT 31.8 (*)    MCH 23.9 (*)    RDW 16.6 (*)    All other components within normal limits  COMPREHENSIVE METABOLIC PANEL - Abnormal; Notable for the following components:   Glucose, Bld 128 (*)    AST 14 (*)    All other components within normal limits  RAPID URINE DRUG SCREEN, HOSP PERFORMED - Abnormal; Notable for the following components:   Cocaine POSITIVE (*)    Amphetamines POSITIVE (*)    Tetrahydrocannabinol POSITIVE (*)    Barbiturates   (*)    Value: Result not available. Reagent lot number recalled by manufacturer.   All other components within normal limits  ACETAMINOPHEN LEVEL - Abnormal; Notable for the following components:   Acetaminophen (Tylenol), Serum <10 (*)    All other components within normal limits  SALICYLATE LEVEL  PREGNANCY, URINE  ETHANOL  MAGNESIUM    EKG EKG Interpretation  Date/Time:  Thursday August 16 2017 07:44:54 EDT Ventricular Rate:  80 PR Interval:    QRS Duration: 94 QT Interval:  422 QTC Calculation: 487 R Axis:   62 Text  Interpretation:  Sinus rhythm Minimal ST depression, inferior leads Borderline prolonged QT interval Confirmed by Eber HongMiller, Ronelle Smallman ([id number]) on 08/16/2017 7:52:22 AM    EKG Interpretation  Date/Time:  Thursday August 16 2017 08:54:36 EDT Ventricular Rate:  81 PR Interval:    QRS Duration: 98 QT Interval:  440 QTC Calculation: 511 R Axis:   54 Text Interpretation:  Sinus rhythm Abnormal inferior Q waves Prolonged QT interval Since last tracing qt has slightly lengthened Confirmed by Eber HongMiller, Wm Sahagun ([id number]) on 08/16/2017 9:52:27 AM        EKG Interpretation  Date/Time:  Thursday August 16 2017 09:59:14 EDT  Ventricular Rate:  78 PR Interval:    QRS Duration: 96 QT Interval:  479 QTC Calculation: 546 R Axis:   53 Text Interpretation:  Sinus rhythm Abnormal inferior Q waves Prolonged QT interval Since last tracing QT continues to prolong Confirmed by Eber Hong ([id number]) on 08/16/2017 10:35:24 AM         Radiology No results found.  Procedures .Critical Care Performed by: Eber Hong, MD Authorized by: Eber Hong, MD   Critical care provider statement:    Critical care time (minutes):  35   Critical care time was exclusive of:  Separately billable procedures and treating other patients and teaching time   Critical care was necessary to treat or prevent imminent or life-threatening deterioration of the following conditions:  CNS failure or compromise and toxidrome   Critical care was time spent personally by me on the following activities:  Blood draw for specimens, development of treatment plan with patient or surrogate, discussions with consultants, evaluation of patient's response to treatment, examination of patient, obtaining history from patient or surrogate, ordering and performing treatments and interventions, ordering and review of laboratory studies, ordering and review of radiographic studies, pulse oximetry, re-evaluation of patient's condition and review of old charts    (including critical care time)  Medications Ordered in ED Medications  magnesium sulfate IVPB 2 g 50 mL (2 g Intravenous New Bag/Given 08/16/17 1252)  sodium chloride 0.9 % bolus 1,000 mL (0 mLs Intravenous Stopped 08/16/17 0945)  sodium chloride 0.9 % bolus 1,000 mL (0 mLs Intravenous Stopped 08/16/17 1105)  magnesium sulfate IVPB 2 g 50 mL (0 g Intravenous Stopped 08/16/17 1105)     Initial Impression / Assessment and Plan / ED Course  I have reviewed the triage vital signs and the nursing notes.  Pertinent labs & imaging results that were available during my care of the patient were reviewed by me and considered in my medical decision making (see chart for details).  Clinical Course as of Aug 16 1257  Thu Aug 16, 2017  0758 Poison control recommends repeat EKG, observation for at least 4 hours, cardiac monitoring, monitor for QT prolongation and QRS prolongation, check for coingestions.  Also needs to be monitored for hypotension which should respond to fluids.   [BM]  [phone] Lab results reviewed showing no signs of acetaminophen or salicylate, normal magnesium, normal metabolic panel except for minimal hyperglycemia, drug screen positive for cocaine, amphetamines and marijuana.  Blood counts show that the patient has anemia with a hemoglobin of 9.7.  Compared to prior results this is slightly lower.  Her blood pressure has dipped down to 94/55, fluid bolus ordered.   [BM]  [phone] The patient continues to be somnolent but arousable, her blood pressure has gradually trended down and is now at 89 systolic.  Second liter of IV fluids has been started.  Repeat EKG did show some slight prolongation of the QT to 511, will continue to watch this.   [BM]    Clinical Course User Index [BM] Eber Hong, MD   Abnormal exam, overdose, consider that this could be severe and will need to talk to poison control, she took approximately 2000 mg of trazodone, it is unclear over what time..  We will also check  acetaminophen and salicylate levels, drug screen, alcohol level, the patient is critically ill with a severe overdose and significant change in her mental status.  Need frequent evaluation, cardiac monitoring and monitoring for decrease in her respiratory status.  QT remains  prolonged, even after magnesium still well over 500 at 520, will need admission to the hospital, blood pressure has responded intermittently to fluids currently at 100 systolic.  Patient remains somnolent  D/w Dr. Sherryll Burger - will admit  Final Clinical Impressions(s) / ED Diagnoses   Final diagnoses:  Intentional drug overdose, initial encounter [facility])  Prolonged QT interval    ED Discharge Orders    None       Eber Hong, MD 08/16/17 1259    Eber Hong, MD 08/16/17 1329

## 2017-08-16 NOTE — ED Notes (Signed)
Updated Beth with Poison Control on status of pt. Pt has had an increase in QT interval from 480 to 540 over a 2hr period. Beth recommends to optimize potassium/magnesium. Repeat an EKG an hour after completion of medication . If narrowing of QT interval, continue to observe and do another EKG at 4 hours after completion of magnesium.  Poison control will call back later to check on status of patient. Dr. Hyacinth MeekerMiller notified.

## 2017-08-16 NOTE — ED Notes (Signed)
Phlebotomy at bedside.

## 2017-08-17 LAB — CBC
HEMATOCRIT: 30.1 % — AB (ref 36.0–46.0)
Hemoglobin: 9 g/dL — ABNORMAL LOW (ref 12.0–15.0)
MCH: 23.9 pg — AB (ref 26.0–34.0)
MCHC: 29.9 g/dL — ABNORMAL LOW (ref 30.0–36.0)
MCV: 79.8 fL (ref 78.0–100.0)
Platelets: 366 10*3/uL (ref 150–400)
RBC: 3.77 MIL/uL — ABNORMAL LOW (ref 3.87–5.11)
RDW: 17 % — AB (ref 11.5–15.5)
WBC: 15.1 10*3/uL — ABNORMAL HIGH (ref 4.0–10.5)

## 2017-08-17 LAB — BASIC METABOLIC PANEL
Anion gap: 11 (ref 5–15)
BUN: 8 mg/dL (ref 6–20)
CO2: 20 mmol/L — ABNORMAL LOW (ref 22–32)
Calcium: 8.4 mg/dL — ABNORMAL LOW (ref 8.9–10.3)
Chloride: 113 mmol/L — ABNORMAL HIGH (ref 98–111)
Creatinine, Ser: 1.04 mg/dL — ABNORMAL HIGH (ref 0.44–1.00)
GFR calc Af Amer: >60 mL/min (ref >60)
GFR calc non Af Amer: >60 mL/min (ref >60)
GLUCOSE: 75 mg/dL (ref 70–99)
Potassium: 3.5 mmol/L (ref 3.5–5.1)
SODIUM: 144 mmol/L (ref 135–145)

## 2017-08-17 LAB — IRON AND TIBC
Iron: 20 ug/dL — ABNORMAL LOW (ref 28–170)
SATURATION RATIOS: 6 % — AB (ref 10.4–31.8)
TIBC: 316 ug/dL (ref 250–450)
UIBC: 296 ug/dL

## 2017-08-17 LAB — MAGNESIUM: Magnesium: 2 mg/dL (ref 1.7–2.4)

## 2017-08-17 LAB — HIV ANTIBODY (ROUTINE TESTING W REFLEX): HIV Screen 4th Generation wRfx: NONREACTIVE

## 2017-08-17 LAB — FERRITIN: FERRITIN: 12 ng/mL (ref 11–307)

## 2017-08-17 MED ORDER — ACETAMINOPHEN 325 MG PO TABS
650.0000 mg | ORAL_TABLET | Freq: Four times a day (QID) | ORAL | Status: DC | PRN
  Administered 2017-08-17: 650 mg via ORAL
  Filled 2017-08-17: qty 2

## 2017-08-17 NOTE — Progress Notes (Addendum)
TTS states they need pt to be medically cleared before they can consult. State pt is still not alert enough for consult. Dr. Sherryll BurgerShah notified. TTS instructed nurse to remove consult until pt is medically cleared

## 2017-08-17 NOTE — Progress Notes (Signed)
PROGRESS NOTE    [name] [name]  AVW:[id number] DOB: 1980/06/18 DOA: 08/16/2017 PCP: Patient, No Pcp Per   Brief Narrative:   [name] [name] is a 37 y.o. female with medical history significant for anxiety/depression, and substance abuse to include narcotics and cocaine who was dropped off at the EMS station by her boyfriend after suspected overdose.  She was excessively sedated and a poor historian, but it appears that she may have taken 20 tablets of 100 mg trazodone. This morning, she is more awake and alert.   Assessment & Plan:   Principal Problem:   Encephalopathy acute Active Problems:   Anxiety and depression   Overdose, accidental or unintentional, initial encounter   QT prolongation   Substance abuse (HCC)   Overdose   1. Acute toxic encephalopathy secondary to drug overdose.  Unclear whether patient was trying to harm herself, but this does not appear at all unintentional.  She tells me today that she has had to take care of her 5 year-old son and she has not slept in 2 days which is why she took the medications. TTS states she is still too drowsy for evaluation and will need to reconsult for evaluation in am. Stable for transfer to med floor today-no further need for tele. Poison Control has recommended one more EKG by noon, but QTc has decreased to normal range (<451ms) to at this point. No QRS prolongation noted. Repeat labs in am. Started regular diet today. 2. Normocytic anemia-downtrending with no overt bleed.  Anemia panel fully pending. Will check FOBT. 3. Anxiety/depression.  Hold home medications. 4. Substance abuse.  Will require counseling on cessation with info on substance abuse centers for outpatient treatment.   DVT prophylaxis:Lovenox Code Status: Full Family Communication: None at bedside Disposition Plan: Observation for improvement, TTS evaluation when more alert. Consider for DC in next 24 hours if stable.   Consultants:   None  Procedures:     None  Antimicrobials:   None   Subjective: Patient seen and evaluated today with no new acute complaints or concerns. No acute concerns or events noted overnight. She is more alert/awake and asking for food and drink.   Objective: Vitals:   08/17/17 0500 08/17/17 0700 08/17/17 0755 08/17/17 0800  BP: 107/61 114/68  105/74  Pulse: 99 96  100  Resp: (!) 21 (!) 24  (!) 23  Temp:   99.4 F (37.4 C)   TempSrc:   Oral   SpO2: 96% 96%  98%  Weight:      Height:        Intake/Output Summary (Last 24 hours) at 08/17/2017 1052 Last data filed at 08/17/2017 0839 Gross per 24 hour  Intake 3541.67 ml  Output -  Net 3541.67 ml   Filed Weights   08/16/17 0733 08/16/17 1455  Weight: 72.6 kg (160 lb) 71.9 kg (158 lb 8.2 oz)    Examination:  General exam: Appears calm and comfortable; less drowsy Respiratory system: Clear to auscultation. Respiratory effort normal. Cardiovascular system: S1 & S2 heard, RRR. No JVD, murmurs, rubs, gallops or clicks. No pedal edema. Gastrointestinal system: Abdomen is nondistended, soft and nontender. No organomegaly or masses felt. Normal bowel sounds heard. Central nervous system: Alert and oriented. No focal neurological deficits. Extremities: Symmetric 5 x 5 power. Skin: No rashes, lesions or ulcers    Data Reviewed: I have personally reviewed following labs and imaging studies  CBC: Recent Labs  Lab 08/16/17 0804 08/17/17 0555  WBC 9.2  15.1*  NEUTROABS 7.3  --   HGB 9.7* 9.0*  HCT 31.8* 30.1*  MCV 78.3 79.8  PLT 390 366   Basic Metabolic Panel: Recent Labs  Lab 08/16/17 0804 08/16/17 0806 08/17/17 0555  NA 139  --  144  K 3.5  --  3.5  CL 104  --  113*  CO2 23  --  20*  GLUCOSE 128*  --  75  BUN 11  --  8  CREATININE 0.92  --  1.04*  CALCIUM 8.9  --  8.4*  MG  --  2.0  2.0 2.0   GFR: Estimated Creatinine Clearance: 72 mL/min (A) (by C-G formula based on SCr of 1.04 mg/dL (H)). Liver Function Tests: Recent Labs  Lab  08/16/17 0804  AST 14*  ALT 11  ALKPHOS 65  BILITOT 0.6  PROT 8.0  ALBUMIN 3.8   No results for input(s): LIPASE, AMYLASE in the last 168 hours. No results for input(s): AMMONIA in the last 168 hours. Coagulation Profile: No results for input(s): INR, PROTIME in the last 168 hours. Cardiac Enzymes: No results for input(s): CKTOTAL, CKMB, CKMBINDEX, TROPONINI in the last 168 hours. BNP (last 3 results) No results for input(s): PROBNP in the last 8760 hours. HbA1C: No results for input(s): HGBA1C in the last 72 hours. CBG: No results for input(s): GLUCAP in the last 168 hours. Lipid Profile: No results for input(s): CHOL, HDL, LDLCALC, TRIG, CHOLHDL, LDLDIRECT in the last 72 hours. Thyroid Function Tests: No results for input(s): TSH, T4TOTAL, FREET4, T3FREE, THYROIDAB in the last 72 hours. Anemia Panel: Recent Labs    08/16/17 0806 08/16/17 1455  FOLATE  --  12.9  RETICCTPCT 1.6  --    Sepsis Labs: No results for input(s): PROCALCITON, LATICACIDVEN in the last 168 hours.  Recent Results (from the past 240 hour(s))  MRSA PCR Screening     Status: None   Collection Time: 08/16/17  2:40 PM  Result Value Ref Range Status   MRSA by PCR NEGATIVE NEGATIVE Final    Comment:        The GeneXpert MRSA Assay (FDA approved for NASAL specimens only), is one component of a comprehensive MRSA colonization surveillance program. It is not intended to diagnose MRSA infection nor to guide or monitor treatment for MRSA infections. Performed at [facility], [address] [place], [place] [address]       Radiology Studies: No results found.    Scheduled Meds: . enoxaparin (LOVENOX) injection  40 mg Subcutaneous Q24H   Continuous Infusions: . lactated ringers 100 mL/hr at 08/17/17 0011     LOS: 0 days    Time spent: 30 minutes    Clifford Benninger Hoover BrunetteD Gilmar Bua, DO [facility] Pager [phone]  If 7PM-7AM, please contact night-coverage [email or web address] Password  [email or web address] 08/17/2017, 10:52 AM

## 2017-08-17 NOTE — Progress Notes (Signed)
Spoke with Beth from poison control, states if EKG is similar to or better than the one taken at 0850. EKG can be done Q6 hrs vs Q4hrs.

## 2017-08-17 NOTE — BH Assessment (Signed)
[facility] Assessment Progress Note    TTS attempted to assess patient, but she was lethargic and unable to stay awake during the assessment process.  It does not appear that patient is medically stable for placement into a psych facility today.  Therefore, TTS consult will need to be re-ordered once patient is medically stable to participate in this process and stable for transfer to another facility.

## 2017-08-18 ENCOUNTER — Inpatient Hospital Stay (HOSPITAL_COMMUNITY)
Admission: AD | Admit: 2017-08-18 | Discharge: 2017-08-20 | DRG: 885 | Disposition: A | Discharge status: Home / Self Care | Payer: Federal, State, Local not specified - Other | Source: Intra-hospital | Attending: Psychiatry | Admitting: Psychiatry

## 2017-08-18 ENCOUNTER — Other Ambulatory Visit: Payer: Self-pay

## 2017-08-18 ENCOUNTER — Encounter (HOSPITAL_COMMUNITY): Payer: Self-pay

## 2017-08-18 DIAGNOSIS — F419 Anxiety disorder, unspecified: Secondary | ICD-10-CM | POA: Diagnosis present

## 2017-08-18 DIAGNOSIS — R4587 Impulsiveness: Secondary | ICD-10-CM | POA: Diagnosis not present

## 2017-08-18 DIAGNOSIS — T43214A Poisoning by selective serotonin and norepinephrine reuptake inhibitors, undetermined, initial encounter: Secondary | ICD-10-CM | POA: Diagnosis present

## 2017-08-18 DIAGNOSIS — F329 Major depressive disorder, single episode, unspecified: Secondary | ICD-10-CM | POA: Diagnosis present

## 2017-08-18 DIAGNOSIS — Z915 Personal history of self-harm: Secondary | ICD-10-CM

## 2017-08-18 DIAGNOSIS — F1490 Cocaine use, unspecified, uncomplicated: Secondary | ICD-10-CM | POA: Diagnosis not present

## 2017-08-18 DIAGNOSIS — G4700 Insomnia, unspecified: Secondary | ICD-10-CM | POA: Diagnosis present

## 2017-08-18 DIAGNOSIS — Z560 Unemployment, unspecified: Secondary | ICD-10-CM

## 2017-08-18 DIAGNOSIS — F1410 Cocaine abuse, uncomplicated: Secondary | ICD-10-CM | POA: Diagnosis present

## 2017-08-18 DIAGNOSIS — Z87891 Personal history of nicotine dependence: Secondary | ICD-10-CM

## 2017-08-18 DIAGNOSIS — F1290 Cannabis use, unspecified, uncomplicated: Secondary | ICD-10-CM | POA: Diagnosis not present

## 2017-08-18 DIAGNOSIS — F1910 Other psychoactive substance abuse, uncomplicated: Secondary | ICD-10-CM

## 2017-08-18 DIAGNOSIS — T50901A Poisoning by unspecified drugs, medicaments and biological substances, accidental (unintentional), initial encounter: Secondary | ICD-10-CM

## 2017-08-18 DIAGNOSIS — F322 Major depressive disorder, single episode, severe without psychotic features: Secondary | ICD-10-CM | POA: Diagnosis present

## 2017-08-18 DIAGNOSIS — Z590 Homelessness: Secondary | ICD-10-CM | POA: Diagnosis not present

## 2017-08-18 LAB — BASIC METABOLIC PANEL WITH GFR
Anion gap: 6 (ref 5–15)
BUN: 8 mg/dL (ref 6–20)
CO2: 25 mmol/L (ref 22–32)
Calcium: 8.1 mg/dL — ABNORMAL LOW (ref 8.9–10.3)
Chloride: 109 mmol/L (ref 98–111)
Creatinine, Ser: 0.66 mg/dL (ref 0.44–1.00)
GFR calc Af Amer: >60 mL/min
GFR calc non Af Amer: >60 mL/min
Glucose, Bld: 105 mg/dL — ABNORMAL HIGH (ref 70–99)
Potassium: 3.4 mmol/L — ABNORMAL LOW (ref 3.5–5.1)
Sodium: 140 mmol/L (ref 135–145)

## 2017-08-18 LAB — CBC
HCT: 28 % — ABNORMAL LOW (ref 36.0–46.0)
Hemoglobin: 8.6 g/dL — ABNORMAL LOW (ref 12.0–15.0)
MCH: 24.2 pg — ABNORMAL LOW (ref 26.0–34.0)
MCHC: 30.7 g/dL (ref 30.0–36.0)
MCV: 78.7 fL (ref 78.0–100.0)
PLATELETS: 352 10*3/uL (ref 150–400)
RBC: 3.56 MIL/uL — ABNORMAL LOW (ref 3.87–5.11)
RDW: 17 % — AB (ref 11.5–15.5)
WBC: 10.1 10*3/uL (ref 4.0–10.5)

## 2017-08-18 LAB — MAGNESIUM: Magnesium: 1.7 mg/dL (ref 1.7–2.4)

## 2017-08-18 MED ORDER — HYDROXYZINE HCL 25 MG PO TABS
25.0000 mg | ORAL_TABLET | Freq: Three times a day (TID) | ORAL | Status: DC | PRN
  Administered 2017-08-18 – 2017-08-19 (×2): 25 mg via ORAL
  Filled 2017-08-18 (×2): qty 1

## 2017-08-18 MED ORDER — TRAZODONE HCL 50 MG PO TABS
50.0000 mg | ORAL_TABLET | Freq: Every evening | ORAL | Status: DC | PRN
  Administered 2017-08-18: 50 mg via ORAL
  Filled 2017-08-18: qty 1

## 2017-08-18 MED ORDER — MAGNESIUM HYDROXIDE 400 MG/5ML PO SUSP: 30.0000 mL | Freq: Every day | ORAL | Status: DC | PRN

## 2017-08-18 MED ORDER — DOXYCYCLINE HYCLATE 100 MG PO TABS
100.0000 mg | ORAL_TABLET | Freq: Two times a day (BID) | ORAL | Status: DC
  Administered 2017-08-18 – 2017-08-20 (×4): 100 mg via ORAL
  Filled 2017-08-18 (×9): qty 1

## 2017-08-18 MED ORDER — ALUM & MAG HYDROXIDE-SIMETH 200-200-20 MG/5ML PO SUSP
30.0000 mL | ORAL | Status: DC | PRN
  Administered 2017-08-19: 30 mL via ORAL
  Filled 2017-08-18: qty 30

## 2017-08-18 MED ORDER — POTASSIUM CHLORIDE CRYS ER 20 MEQ PO TBCR
40.0000 meq | EXTENDED_RELEASE_TABLET | Freq: Once | ORAL | Status: AC
  Administered 2017-08-18: 40 meq via ORAL
  Filled 2017-08-18: qty 2

## 2017-08-18 MED ORDER — DOXYCYCLINE HYCLATE 100 MG PO TABS: 100.0000 mg | ORAL_TABLET | Freq: Two times a day (BID) | ORAL | 0 refills | Status: DC

## 2017-08-18 MED ORDER — ACETAMINOPHEN 325 MG PO TABS
650.0000 mg | ORAL_TABLET | Freq: Four times a day (QID) | ORAL | Status: DC | PRN
  Administered 2017-08-18 – 2017-08-19 (×2): 650 mg via ORAL
  Filled 2017-08-18 (×2): qty 2

## 2017-08-18 MED ORDER — DOXYCYCLINE HYCLATE 100 MG PO TABS
100.0000 mg | ORAL_TABLET | Freq: Two times a day (BID) | ORAL | Status: DC
  Administered 2017-08-18: 100 mg via ORAL
  Filled 2017-08-18: qty 1

## 2017-08-18 MED ORDER — TRAZODONE HCL 50 MG PO TABS
50.0000 mg | ORAL_TABLET | Freq: Every evening | ORAL | Status: DC | PRN
  Filled 2017-08-18: qty 1

## 2017-08-18 NOTE — [facility] Suicide Risk Assessment (Signed)
[facility] INPATIENT:  Family/Significant Other Suicide Prevention Education  Suicide Prevention Education:  Patient Refusal for Family/Significant Other Suicide Prevention Education: The patient [name] [name] has refused to provide written consent for family/significant other to be provided Family/Significant Other Suicide Prevention Education during admission and/or prior to discharge.  Physician notified.  Suicide Prevention Education was reviewed thoroughly with patient, including risk factors, warning signs, and what to do.  Mobile Crisis services were described and that telephone number pointed out, with encouragement to patient to put this number in personal cell phone.  Brochure was provided to patient to share with natural supports.  Patient acknowledged the ways in which they are at risk, and how working through each of their issues can gradually start to reduce their risk factors.  Patient was encouraged to think of the information in the context of people in their own lives.  Patient denied having access to firearms  Patient verbalized understanding of information provided.  Patient endorsed a desire to live.      Carloyn JaegerMareida J Grossman-Orr 08/18/2017, 5:51 PM

## 2017-08-18 NOTE — Discharge Summary (Signed)
Physician Discharge Summary  [name] [name] BJY:[id number] DOB: 07/06/1980 DOA: 08/16/2017  PCP: Patient, No Pcp Per  Admit date: 08/16/2017 Discharge date: 08/18/2017  Admitted From: Home Disposition: Inpatient psychiatry  Recommendations for Outpatient Follow-up:  1. Discharge to inpatient psychiatry for further management  Discharge Condition: Stable CODE STATUS: Full code Diet recommendation: Regular diet  Brief/Interim Summary: 37 year old female with a history of anxiety/depression admitted to the hospital with acute toxic encephalopathy secondary to drug overdose.  Patient had reportedly taken a large amount of trazodone tablets.  He was noted to be lethargic on arrival and was unable to participate in history.  She was monitored in the hospital for any cardiac arrhythmias.  QTC remained stable.  Patient also was noted to have a history of illicit drug use.  She was seen by psychiatry and was felt to be a candidate for inpatient psychiatry.  On this was discussed with patient, she was very upset and tearful, but at this time is willing to go voluntarily.  Describe having furuncles in her axilla bilaterally.  This is likely related to shaving.  She will be prescribed a course of doxycycline.  She should continue warm compresses on this area and to keep the area clean.  This can be followed up by her primary physician.  She is medically clear for discharge.  Discharge Diagnoses:  Principal Problem:   Encephalopathy acute Active Problems:   Anxiety and depression   Overdose, accidental or unintentional, initial encounter   QT prolongation   Substance abuse (HCC)   Overdose    Discharge Instructions  Discharge Instructions    Diet - low sodium heart healthy   Complete by:  As directed    Increase activity slowly   Complete by:  As directed      Allergies as of 08/18/2017   No Known Allergies     Medication List    TAKE these medications   doxycycline 100 MG tablet Commonly  known as:  VIBRA-TABS Take 1 tablet (100 mg total) by mouth every 12 (twelve) hours for 7 days.       No Known Allergies  Consultations:  Psychiatry   Procedures/Studies:  No results found.    Subjective: Complains of some "infection" in her axilla bilaterally.  Wants to go home.  Discharge Exam: Vitals:   08/17/17 2145 08/18/17 0630  BP: 101/69 105/67  Pulse: 86 89  Resp: 20 18  Temp: 98.4 F (36.9 C) 98.2 F (36.8 C)  SpO2: 98% 98%   Vitals:   08/17/17 1100 08/17/17 1320 08/17/17 2145 08/18/17 0630  BP: 108/63 (!) 87/52 101/69 105/67  Pulse: 96 98 86 89  Resp: 20 16 20 18   Temp:  98.6 F (37 C) 98.4 F (36.9 C) 98.2 F (36.8 C)  TempSrc:  Oral Oral Oral  SpO2: 96% 97% 98% 98%  Weight:      Height:        General: Pt is alert, awake, not in acute distress Cardiovascular: RRR, S1/S2 +, no rubs, no gallops Respiratory: CTA bilaterally, no wheezing, no rhonchi Abdominal: Soft, NT, ND, bowel sounds + Extremities: no edema, no cyanosis Skin: Boils with surrounding induration bilaterally in axilla, likely related to shaving, no discharge noted    The results of significant diagnostics from this hospitalization (including imaging, microbiology, ancillary and laboratory) are listed below for reference.     Microbiology: Recent Results (from the past 240 hour(s))  MRSA PCR Screening     Status: None  Collection Time: 08/16/17  2:40 PM  Result Value Ref Range Status   MRSA by PCR NEGATIVE NEGATIVE Final    Comment:        The GeneXpert MRSA Assay (FDA approved for NASAL specimens only), is one component of a comprehensive MRSA colonization surveillance program. It is not intended to diagnose MRSA infection nor to guide or monitor treatment for MRSA infections. Performed at [facility], [address] [place], [place] [address]      Labs: BNP (last 3 results) No results for input(s): BNP in the last 8760 hours. Basic Metabolic Panel: Recent  Labs  Lab 08/16/17 0804 08/16/17 0806 08/17/17 0555 08/18/17 0725  NA 139  --  144 140  K 3.5  --  3.5 3.4*  CL 104  --  113* 109  CO2 23  --  20* 25  GLUCOSE 128*  --  75 105*  BUN 11  --  8 8  CREATININE 0.92  --  1.04* 0.66  CALCIUM 8.9  --  8.4* 8.1*  MG  --  2.0  2.0 2.0 1.7   Liver Function Tests: Recent Labs  Lab 08/16/17 0804  AST 14*  ALT 11  ALKPHOS 65  BILITOT 0.6  PROT 8.0  ALBUMIN 3.8   No results for input(s): LIPASE, AMYLASE in the last 168 hours. No results for input(s): AMMONIA in the last 168 hours. CBC: Recent Labs  Lab 08/16/17 0804 08/17/17 0555 08/18/17 0725  WBC 9.2 15.1* 10.1  NEUTROABS 7.3  --   --   HGB 9.7* 9.0* 8.6*  HCT 31.8* 30.1* 28.0*  MCV 78.3 79.8 78.7  PLT 390 366 352   Cardiac Enzymes: No results for input(s): CKTOTAL, CKMB, CKMBINDEX, TROPONINI in the last 168 hours. BNP: Invalid input(s): POCBNP CBG: No results for input(s): GLUCAP in the last 168 hours. D-Dimer No results for input(s): DDIMER in the last 72 hours. Hgb A1c No results for input(s): HGBA1C in the last 72 hours. Lipid Profile No results for input(s): CHOL, HDL, LDLCALC, TRIG, CHOLHDL, LDLDIRECT in the last 72 hours. Thyroid function studies No results for input(s): TSH, T4TOTAL, T3FREE, THYROIDAB in the last 72 hours.  Invalid input(s): FREET3 Anemia work up Recent Labs    08/16/17 0806 08/16/17 0808 08/16/17 1455  FOLATE  --   --  12.9  FERRITIN  --  12  --   TIBC  --  316  --   IRON  --  20*  --   RETICCTPCT 1.6  --   --    Urinalysis    Component Value Date/Time   COLORURINE YELLOW 06/21/2017 1420   APPEARANCEUR HAZY (A) 06/21/2017 1420   LABSPEC 1.026 06/21/2017 1420   PHURINE 6.0 06/21/2017 1420   GLUCOSEU NEGATIVE 06/21/2017 1420   HGBUR SMALL (A) 06/21/2017 1420   BILIRUBINUR NEGATIVE 06/21/2017 1420   BILIRUBINUR neg 12/23/2012 1640   KETONESUR NEGATIVE 06/21/2017 1420   PROTEINUR NEGATIVE 06/21/2017 1420   UROBILINOGEN 0.2  01/08/2014 2016   NITRITE NEGATIVE 06/21/2017 1420   LEUKOCYTESUR SMALL (A) 06/21/2017 1420   Sepsis Labs Invalid input(s): PROCALCITONIN,  WBC,  LACTICIDVEN Microbiology Recent Results (from the past 240 hour(s))  MRSA PCR Screening     Status: None   Collection Time: 08/16/17  2:40 PM  Result Value Ref Range Status   MRSA by PCR NEGATIVE NEGATIVE Final    Comment:        The GeneXpert MRSA Assay (FDA approved for NASAL specimens only),  is one component of a comprehensive MRSA colonization surveillance program. It is not intended to diagnose MRSA infection nor to guide or monitor treatment for MRSA infections. Performed at [facility], [address] [place], [place] [address]      Time coordinating discharge:  SIGNED:   Erick Blinks, MD  [facility] 08/18/2017, 12:43 PM Pager   If 7PM-7AM, please contact night-coverage [email or web address] Password [email or web address]

## 2017-08-18 NOTE — Progress Notes (Signed)
D.  Pt pleasant on approach, no complaints voiced.  Pt states that she did not take as many Trazodone as was reported, states she took "about 3".  Pt was positive for evening AA group, observed engaged in appropriate interaction with peers on the unit.  Pt denies SI/HI/AVH at this time.  Pt is a new admission to the unit.  A.  Support and encouragement offered, medication given as ordered  R.  Pt remains safe on the unit, will continue to monitor.

## 2017-08-18 NOTE — Progress Notes (Signed)
D: [facility], voluntarily. Patient is a 37 yo female with a history of anxiety, substance abuse, and depression. Her boyfriend reported to staff that she took #20 trazodone and was drinking alcohol. She denies this, and states this was not a suicide attempt and that she only took #3 trazodone 150mg  tablets because "I just wanted to sleep." She did admit to drinking a "couple beers." She was guarded and provided incongruent responses to questions about substance use, alcohol consumption. She admits to legal issues, but states "they are just for a missed inspection on my car." She has two children, but her mother has custody of them. UDS +Cocaine and THC. Alcohol was negative on admission. She has boils under bilateral arms, and is being treated with doxycycline.  A: Skin assessment performed per protocol, no contraband noted, and otherwise unremarkable (boils). Patient had no belongings at time of admission. Unit orientation completed. Care plan and unit routines reviewed with patient, understanding verbalized. Emotional support offered to patient. Encouraged patient to voice concerns. Fluids offered to patient. Q15 minute checks initiated for safety on and off unit.  R: Patient contracts for safety.

## 2017-08-18 NOTE — BH Assessment (Addendum)
Assessment Note  [name] [name] is an 37 y.o. female who was brought to [facility] by EMS on 08-16-2017 after overdosing on Trazodone.  The Patient was not alert enough to participate in a tele-assessment until 08-18-2017.  Patient presented orientated x3, mood "anxious and depressed", affect congruent with mood, Patient denied current SI, HI, AVH.  Patient denied taking 20 Trazodone tablets on 08-16-2017.  She reports not being able to sleep for several days and decided to take 3 to 4 Trazodone tablets versus the 1 tablet prescribed in order to get to sleep.  Patient reports a suicide attempt in 2015 after attempting to cut her throat.  She reports being hospitalized for several days, however unable to recall hospital name.  She also reports several more psychiatric hospitalizations for "Bipolar" diagnosis, however declined to name dates and locations of the hospitalizations.  The Patient reports chronic anxiety and depression.  She reports feeling depressed and very anxious about  being incarcerated for several years and losing custody of minor child to her Mother, her child dying in 2742, 49 year old Son is currently addicted to heroin, and currently being unemployed and homeless.  The Patient reports snorting powdered cocaine 1x per week and would not disclose the amount.  She reports smoking Cannabis daily of a joint or bowl to reduce anxiety.  Patient reports smoking Cannabis since age of 74 years.  Patient's UDS was positive for amphetamines also, however she denied usage.  Patient denied ever receiving substance use treatment.  Patient reports being unable to afford outpatient mental health treatment and would not say who or where receiving Trazodone.  Patient was pulling at monitoring equipment and stating "I want to leave" at the end of the tele-assessment.  Patient meets inpatient criteria per Reola Calkins, NP.   Dr. Kerry Hough to provide disposition.    Diagnosis:  Major Depressive Disorder, recurrent, severe;  Cannabis Use Disorder, moderate;  Stimulant (Cocaine) Use Disorder, moderate  Past Medical History:  Past Medical History:  Diagnosis Date  . Anxiety   . Depression   . Hyperlipidemia   . Substance abuse (HCC)    benzos, cocaine, opiates    Past Surgical History:  Procedure Laterality Date  . CESAREAN SECTION    . TUBAL LIGATION      Family History:  Family History  Problem Relation Age of Onset  . Hypertension Father   . Cancer Paternal Aunt        BREAST  . COPD Paternal Grandfather   . Hyperlipidemia Paternal Grandfather   . Hypertension Paternal Grandfather     Social History:  reports that she has quit smoking. Her smoking use included cigarettes. She smoked 1.00 pack per day. She has never used smokeless tobacco. She reports that she drinks alcohol. She reports that she has current or past drug history. Drugs: Marijuana and Cocaine.  Additional Social History:  Substance #1 Name of Substance 1: Cocaine, powdered 1 - Age of First Use: Unknown 1 - Amount (size/oz): 1 ine or 2 1 - Frequency: 1x per week 1 - Last Use / Amount: Unknown Substance #2 Name of Substance 2: Amphetamine 2 - Age of First Use: Patient denied using, UDS positive  Substance #3 Name of Substance 3: Cannabis  3 - Amount (size/oz): Joint or bowl 3 - Frequency: daily  CIWA: CIWA-Ar BP: 105/67 Pulse Rate: 89 COWS:    Allergies: No Known Allergies  Home Medications:  No medications prior to admission.    OB/GYN Status:  No  LMP recorded.  General Assessment Data [facility] of Assessment: [facility] TTS Assessment: In system Is this a Tele or Face-to-Face Assessment?: Face-to-Face Is this an Initial Assessment or a Re-assessment for this encounter?: Initial Assessment Marital status: Single Maiden name: Bader Is patient pregnant?: No Pregnancy Status: No Living Arrangements: Other (Comment)(Homeless) Can pt return to current living arrangement?: Yes Admission Status: Voluntary Is patient  capable of signing voluntary admission?: Yes Referral Source: Other(Medical Provider) Insurance type: Medicaid, [place]  Medical Screening Exam [facility] Walk[facility]in ONLY) Medical Exam completed: Yes  Crisis Care Plan Living Arrangements: Other (Comment)(Homeless) Legal Guardian: Other: Name of Psychiatrist: None Name of Therapist: None  Education Status Is patient currently in school?: No Is the patient employed, unemployed or receiving disability?: Unemployed  Risk to self with the past 6 months Suicidal Ideation: No-Not Currently/Within Last 6 Months Has patient been a risk to self within the past 6 months prior to admission? : Yes Suicidal Intent: No-Not Currently/Within Last 6 Months Has patient had any suicidal intent within the past 6 months prior to admission? : Yes Is patient at risk for suicide?: Yes Suicidal Plan?: No-Not Currently/Within Last 6 Months Has patient had any suicidal plan within the past 6 months prior to admission? : Yes Access to Means: No What has been your use of drugs/alcohol within the last 12 months?: Cocaine, amphetamines, THC Previous Attempts/Gestures: Yes How many times?: 1 Other Self Harm Risks: None Triggers for Past Attempts: Other (Comment)(depression and anxiety) Intentional Self Injurious Behavior: None Family Suicide History: Unknown Recent stressful life event(s): Other (Comment)(17 yr old Son addicted to heroin) Persecutory voices/beliefs?: No Depression: Yes Depression Symptoms: Tearfulness, Feeling angry/irritable Substance abuse history and/or treatment for substance abuse?: Yes(Cocaine, amphetamines, THC) Suicide prevention information given to non-admitted patients: Not applicable  Risk to Others within the past 6 months Homicidal Ideation: No Does patient have any lifetime risk of violence toward others beyond the six months prior to admission? : Unknown Thoughts of Harm to Others: No Current Homicidal Intent: No Current Homicidal  Plan: No Access to Homicidal Means: No Identified Victim: N/A History of harm to others?: No Assessment of Violence: None Noted Violent Behavior Description: N/A Does patient have access to weapons?: No Criminal Charges Pending?: No Does patient have a court date: Yes Court Date: 08/23/17(Patient reports for an expired tag) Is patient on probation?: No  Psychosis Hallucinations: None noted Delusions: None noted  Mental Status Report Appearance/Hygiene: In hospital gown Eye Contact: Fair Motor Activity: Agitation Speech: Logical/coherent Level of Consciousness: Alert Mood: Depressed, Anxious Affect: Anxious, Depressed Anxiety Level: Moderate Thought Processes: Coherent, Relevant Judgement: Impaired Orientation: Person, Place, Time Obsessive Compulsive Thoughts/Behaviors: None  Cognitive Functioning Concentration: Decreased Memory: Recent Intact, Remote Intact Is patient IDD: No Is patient DD?: No Insight: Poor Impulse Control: Poor Appetite: Fair Have you had any weight changes? : No Change Sleep: Decreased Total Hours of Sleep: 0 Vegetative Symptoms: None  ADLScreening [facility] Assessment Services) Patient's cognitive ability adequate to safely complete daily activities?: Yes Patient able to express need for assistance with ADLs?: No Independently performs ADLs?: Yes (appropriate for developmental age)  Prior Inpatient Therapy Prior Inpatient Therapy: Yes Prior Therapy Dates: 2015 Prior Therapy Facilty/Provider(s): Unknown Reason for Treatment: Suicide attempt  Prior Outpatient Therapy Prior Outpatient Therapy: No(Patient denied) Does patient have an ACCT team?: No Does patient have Intensive In-House Services?  : No Does patient have [facility] services? : No Does patient have P4CC services?: No  ADL Screening (condition at time of admission)  Patient's cognitive ability adequate to safely complete daily activities?: Yes Is the patient deaf or have difficulty  hearing?: No Does the patient have difficulty seeing, even when wearing glasses/contacts?: No Does the patient have difficulty concentrating, remembering, or making decisions?: No Patient able to express need for assistance with ADLs?: No Does the patient have difficulty dressing or bathing?: No Independently performs ADLs?: Yes (appropriate for developmental age) Does the patient have difficulty walking or climbing stairs?: No Weakness of Legs: None Weakness of Arms/Hands: None  Home Assistive Devices/Equipment Home Assistive Devices/Equipment: None  Therapy Consults (therapy consults require a physician order) PT Evaluation Needed: No OT Evalulation Needed: No SLP Evaluation Needed: No Abuse/Neglect Assessment (Assessment to be complete while patient is alone) Abuse/Neglect Assessment Can Be Completed: Yes Physical Abuse: Denies Verbal Abuse: Denies Sexual Abuse: Denies Exploitation of patient/patient's resources: Denies Self-Neglect: Denies Values / Beliefs Cultural Requests During Hospitalization: None Spiritual Requests During Hospitalization: None Consults Spiritual Care Consult Needed: No Social Work Consult Needed: No Merchant navy officer (For Healthcare) Does Patient Have a Medical Advance Directive?: No Would patient like information on creating a medical advance directive?: No - Patient declined Nutrition Screen- [facility] Patient's home diet: Regular Has the patient recently lost weight without trying?: No Has the patient been eating poorly because of a decreased appetite?: No Malnutrition Screening Tool Score: 0        Disposition:  Disposition Initial Assessment Completed for this Encounter: Yes  On Site Evaluation by:   Reviewed with Physician:    Dey-Johnson,Isabella Roemmich 08/18/2017 11:48 AM

## 2017-08-18 NOTE — Progress Notes (Signed)
Patient did attend the evening speaker AA meeting.  

## 2017-08-18 NOTE — Tx Team (Signed)
Initial Treatment Plan 08/18/2017 4:30 PM [name] GaussAmanda K [name] [id number]:[id number]    PATIENT STRESSORS: Financial difficulties Legal issue   PATIENT STRENGTHS: Average or above average intelligence Capable of independent living Physical Health   PATIENT IDENTIFIED PROBLEMS: 1. "I need to be discharged before my court date on the 11th of this month."  2. "I don't need to be here; I didn't try to commit suicide."                   DISCHARGE CRITERIA:  Improved stabilization in mood, thinking, and/or behavior  PRELIMINARY DISCHARGE PLAN: Outpatient therapy  PATIENT/FAMILY INVOLVEMENT: This treatment plan has been presented to and reviewed with the patient, [name] GaussAmanda K [name].  The patient has been given the opportunity to ask questions and make suggestions.  Kirstie MirzaJonathan C Kaid Seeberger, RN 08/18/2017, 4:30 PM

## 2017-08-18 NOTE — Progress Notes (Signed)
[facility]. The reporting number is [phone]. The attending MD is Clary. The accepting provider is [employer]. She will be in bed 303-1. Nurse has been notified of placement. She can arrive now.   [name] [name] MSW, LCSW, LCAS Clinical Social Worker 08/18/2017 2:08 PM

## 2017-08-18 NOTE — Progress Notes (Signed)
Report called to Vikki PortsValerie, RN at [facility] in [place].  Patient transported to facility by [facility] transport.

## 2017-08-18 NOTE — [facility] Counselor (Signed)
Adult Comprehensive Assessment  Patient ID: [name] [name], female   DOB: 03/14/1980, 37 y.o.   MRN: [id number]  Information Source: Information source: Patient  Current Stressors:  Patient states their primary concerns and needs for treatment are:: "I need to go to work.  I need to get out of the hospital."  Also asks for help with her anxiety, and knows there are some things she cannot get because of her past addictions. Patient states their goals for this hospitilization and ongoing recovery are:: "I've been drinking a little bit and took 2-3 Trazodone, and when I started falling asleep, my boyfriend panicked and thought it was more.  I am not suicidal and do not want to be in the hospital for days." Educational / Learning stressors: Denies stressors Employment / Job issues: Denies stressors.  Even though Tele-Assessment says is unemployed, she denies having said this. Family Relationships: Denies stressors Financial / Lack of resources (include bankruptcy): Typical stressors Housing / Lack of housing: Staying with boyfriend except when they are fussing with each other.  Even though Tele-Assessment says is homeless, she denies having said this. Physical health (include injuries & life threatening diseases): "It's fine except for boils in armpits.  I would like some Ibuprofen right now." Social relationships: "We're good." Substance abuse: "I only did cocaine one time socially.  Marijuana use does not stress me, relaxes me." Bereavement / Loss: Son in 1999 at 4 months.  Living/Environment/Situation:  Living Arrangements: Spouse/significant other Living conditions (as described by patient or guardian): Good Who else lives in the home?: Boyfriend How long has patient lived in current situation?: 5 months What is atmosphere in current home: Comfortable, Paramedic, Supportive, Temporary  Family History:  Marital status: Long term relationship Long term relationship, how long?: 5-6 months What  types of issues is patient dealing with in the relationship?: None - they do fuss with each other sometimes, normal.  States when her children's father gets out of prison, she will go back to him. Additional relationship information: Was in a relationship with children's father for 20 years.  He is currently in prison, 7th time. Are you sexually active?: Yes What is your sexual orientation?: Straight Has your sexual activity been affected by drugs, alcohol, medication, or emotional stress?: No Does patient have children?: Yes How many children?: 3 How is patient's relationship with their children?: 1 child died in 20 at age 54 months, and she refuses to "bring this up again," 17yo son and 13yo daughter.  States she has a good relationship with both, does not have custody of either child because does not have her own place and their father is in prison.  Daughter was just in hospital.  Son is using some drugs.  Childhood History:  By whom was/is the patient raised?: Both parents Additional childhood history information: Parents are now divorced, when pt was 20yo. Description of patient's relationship with caregiver when they were a child: With both parents, had a good relationship and states she was a "rich, spoilt kid." Patient's description of current relationship with people who raised him/her: Mother - her focus now is patient's children; Father - has another child now so is focused on him.  She feels this is only right, does not complain. How were you disciplined when you got in trouble as a child/adolescent?: Items taken away Does patient have siblings?: Yes Number of Siblings: 1 Description of patient's current relationship with siblings: 16yo half-brother - does not see him much Did patient suffer  any verbal/emotional/physical/sexual abuse as a child?: No Did patient suffer from severe childhood neglect?: No Has patient ever been sexually abused/assaulted/raped as an adolescent or adult?:  No Was the patient ever a victim of a crime or a disaster?: No Witnessed domestic violence?: No Has patient been effected by domestic violence as an adult?: No  Education:  Highest grade of school patient has completed: 8th grade Currently a student?: No Learning disability?: No  Employment/Work Situation:   Employment situation: Employed 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  Alcohol very rarely. If attempted suicide, did drugs/alcohol play a role in this?: Yes(States this was not a suicide attempt) Alcohol/Substance Abuse Treatment Hx: Past Tx, Inpatient If yes, describe treatment: [facility] 4 years ago.  This was the longest.  Has been to detox before, does not remember dates.  Once had to detox from Xanax. Has alcohol/substance abuse ever caused legal problems?: No  Social Support System:   Patient's Community Support System: Good Describe Community Support System: Boyfriend, son, family Type of faith/religion: [employer] How does patient's faith help to cope with current illness?: "It helps me through a lot of problems."  Leisure/Recreation:   Leisure and Hobbies: "Getting too old to  do anything for fun, cook dinner for my children."  Also likes to watch her boyfriend put on tattoos.  Strengths/Needs:   What is the patient's perception of their strengths?: Cooking, cleaning, being a housewife, reliable employee, leader according to preacher, graceful according to preacher. Patient states they can use these personal strengths during their treatment to contribute to their recovery: "If I focus my attention on me (which I haven't done for awhile) I can accomplish anything." Patient states these barriers may affect/interfere with their treatment: Nothing Patient states these barriers may affect their return to the community: Nothing Other important information patient would like considered in planning for their treatment: Would like to get some help with her anxiety.  Discharge Plan:   Currently receiving community mental health services: No Patient states concerns and preferences for [facility]  Patient states they will know when they are safe and ready for discharge when: "I would be ready and safe to go Monday.  I would like to get on some medicine for anxiety.  Buspar helped in the past." Does patient have access to transportation?: Yes Does patient have financial barriers related to discharge medications?: No Patient description of barriers related to discharge medications: If it is on the $4 list can get it.  Summary/Recommendations:   Summary and Recommendations (to be completed by the evaluator): Patient is a 37yo female admitted voluntarily with reports that she overdosed on Trazodone, which she later denied when she was able to be alert enough  for admission assessment.  She does report that she had some alcohol and then took 3-4 Trazodone to sleep, rather than the 1 tablet prescribed.  She states she has had several hospitalizations for detox, has had treatment for Xanax addiction, and has been treated for Bipolar disorder, chronic  depression and anxiety several times.  She reported a 2015 suicide attempt by trying to cut her throat to the first assessor, but now denies ever having a suicide attempt.  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  Primary stressors include being in the hospital, worrying about her children who have MH/SA issues, and having anxiety symptoms that are untreated.  She reports using marijuana to relax and denies it causes problems.  She states she only used cocaine one time prior to admission and drinks rarely.   She [facility] to [facility] in [place] and can return to live with boyfriend.  Patient will benefit from crisis stabilization, medication evaluation, group therapy and psychoeducation, in addition to case management and discharge planning. At discharge it is recommended that Patient adhere to the established discharge plan and continue in treatment.  Lynnell Chad. 08/18/2017

## 2017-08-19 DIAGNOSIS — F1290 Cannabis use, unspecified, uncomplicated: Secondary | ICD-10-CM

## 2017-08-19 DIAGNOSIS — F322 Major depressive disorder, single episode, severe without psychotic features: Principal | ICD-10-CM

## 2017-08-19 DIAGNOSIS — R450 Nervousness: Secondary | ICD-10-CM

## 2017-08-19 DIAGNOSIS — G4700 Insomnia, unspecified: Secondary | ICD-10-CM

## 2017-08-19 DIAGNOSIS — R4587 Impulsiveness: Secondary | ICD-10-CM

## 2017-08-19 DIAGNOSIS — F419 Anxiety disorder, unspecified: Secondary | ICD-10-CM

## 2017-08-19 DIAGNOSIS — F1490 Cocaine use, unspecified, uncomplicated: Secondary | ICD-10-CM

## 2017-08-19 MED ORDER — POTASSIUM CHLORIDE CRYS ER 20 MEQ PO TBCR
20.0000 meq | EXTENDED_RELEASE_TABLET | Freq: Two times a day (BID) | ORAL | Status: AC
  Administered 2017-08-19 – 2017-08-20 (×2): 20 meq via ORAL
  Filled 2017-08-19 (×3): qty 1

## 2017-08-19 NOTE — Progress Notes (Signed)
Patient stated after discharge that her plan is to live with boyfriend Donita BrooksMatt Mabry phone [phone].  Stated she is not going to live with her dad after [facility] at this time.

## 2017-08-19 NOTE — Progress Notes (Signed)
D:  Patient's self inventory sheet, patient has fair sleep, no sleep medication.  Good appetite, normal energy level, good concentration.  Denied depression, hopeless and anxiety.  Denied withdrawals.  Denied SI.  Denied physical problems.  Denied physical pain.  Goal is discharge.   A:  Medications administered per MD orders.  Emotional support and encouragement given patient. R:  Denied SI and HI, contracts for safety.  Denied A/V hallucinations.  Safety maintained with 15 minute checks.

## 2017-08-19 NOTE — Progress Notes (Signed)
Patient did attend the evening speaker AA meeting.  

## 2017-08-19 NOTE — [facility] Group Notes (Signed)
[facility] Group Notes: (Clinical Social Work)   08/19/2017      Type of Therapy:  Group Therapy   Participation Level:  Did Not Attend despite [facility] prompting   Blythe Veach N Vickki Igou, LCSW  08/19/2017 11:45 AM   

## 2017-08-19 NOTE — Plan of Care (Signed)
Nurse discussed anxiety, depression, coping skills with patient. 

## 2017-08-19 NOTE — [facility] Suicide Risk Assessment (Signed)
[facility]   Nursing information obtained from:  Patient Demographic factors:  Caucasian, Low socioeconomic status Current Mental Status:  NA Loss Factors:  NA Historical Factors:  Impulsivity Risk Reduction Factors:  Employed, Living with another person, especially a relative  Total Time spent with patient: 30 minutes Principal Problem: <principal problem not specified> Diagnosis:   Patient Active Problem List   Diagnosis Date Noted  . MDD (major depressive disorder), severe (HCC) [F32.2] 08/18/2017  . Anxiety and depression [F41.9, F32.9] 08/16/2017  . Overdose, accidental or unintentional, initial encounter [T50.901A] 08/16/2017  . Encephalopathy acute [G93.40] 08/16/2017  . QT prolongation [R94.31] 08/16/2017  . Substance abuse (HCC) [F19.10] 08/16/2017  . Overdose [T50.901A] 08/16/2017  . Stress and adjustment reaction [F43.29] 02/17/2013  . SOB (shortness of breath) [R06.02] 02/17/2013  . Panic anxiety syndrome [F41.0] 02/17/2013  . 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  Patient is a 37 year old female with a past psychiatric history significant for polysubstance abuse who [facility] to [facility] emergency department on 08/16/2017 after reportedly overdosing on 20 trazodone tablets.  She denies this.  She stated that she and her boyfriend were in an argument, and she had been celebrating her birthday with substances.  The argument led to irritability in the patient.  She was unable to sleep, so she mixed 3-4 trazodone tablets versus the one that she supposed to take for sleep.  In the emergency department it was found in tele-psychiatry assessment that she was not alert and able to participate in the assessment.  She currently denies any suicidal ideation.  Her last psychiatric hospitalization in our facility was in 2015.  That was for benzodiazepine abuse/dependence.  She  stated she had not used Xanax in many years.  She said it made her too sleepy and she was unable to pay attention to her teenagers.  She stated she did live with this boyfriend for approximately 6 months.  Her 2 children are in the custody of her mother.  She denied any charges.  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  She does have difficulty reading and writing.  She was admitted to the hospital for evaluation and stabilization.  Continued Clinical Symptoms:  Alcohol Use Disorder Identification Test Final Score (AUDIT): 6 The "Alcohol Use Disorders Identification Test", Guidelines for Use in Primary Care, Second Edition.  World Science writerHealth Organization [facility](WHO). Score between 0-7:  no or low risk or alcohol related problems. Score between 8-15:  moderate risk of alcohol related problems. Score between 16-19:  high risk of alcohol related problems. Score 20 or above:  warrants further diagnostic evaluation for alcohol dependence and treatment.   CLINICAL FACTORS:   Alcohol/Substance Abuse/Dependencies   Musculoskeletal: Strength & Muscle Tone: within normal limits Gait & Station: normal Patient leans: N/A  Psychiatric Specialty Exam: Physical Exam  Nursing note and vitals reviewed. Constitutional: She is oriented to person, place, and time. She appears well-developed and well-nourished.  HENT:  Head: Normocephalic and atraumatic.  Respiratory: Effort normal.  Neurological: She is alert and oriented to person, place, and time.    ROS  Blood pressure (!) 119/97, pulse 97, temperature 99.9 F (37.7 C), temperature source Oral, resp. rate 16, height 5\' 7"  (1.702 m), weight 74.4 kg (164 lb), SpO2 100 %.Body mass index is 25.69 kg/m.  General Appearance: Disheveled  Eye Contact:  Fair  Speech:  Normal Rate  Volume:  Normal  Mood:  Anxious  Affect:  Congruent  Thought Process:  Coherent  Orientation:  Full (Time, Place, and Person)  Thought Content:  Logical  Suicidal Thoughts:  No   Homicidal Thoughts:  No  Memory:  Immediate;   Fair Recent;   Fair Remote;   Fair  Judgement:  Intact  Insight:  Lacking  Psychomotor Activity:  Normal  Concentration:  Concentration: Fair and Attention Span: Fair  Recall:  [employer] of Knowledge:  Fair  Language:  Good  Akathisia:  Negative  Handed:  Right  AIMS (if indicated):     Assets:  Communication Skills Desire for Improvement Physical Health Resilience  ADL's:  Intact  Cognition:  WNL  Sleep:  Number of Hours: 6      COGNITIVE FEATURES THAT CONTRIBUTE TO RISK:  None    SUICIDE RISK:   Minimal: No identifiable suicidal ideation.  Patients presenting with no risk factors but with morbid ruminations; may be classified as minimal risk based on the severity of the depressive symptoms  PLAN OF CARE: Patient is seen and examined.  Patient is a 37 year old female with the above-stated past psychiatric history who was admitted after an intentional overdose.  The patient stated that she took 3-4 extra trazodone to try and get to sleep because of an argument with her boyfriend.  She denies that she took 20 trazodone tablets.  She denied suicidal ideation.  She is abusing substances, but does not believe it is a problem.  She describes it as intermittent.  She will be admitted to the hospital.  She will be integrated into the milieu.  She will meet with social work both individually and in groups.  We will attempt to collect collateral information from her mother, father or boyfriend.  She will be placed on 15-minute checks.  She will be monitored for withdrawal symptoms as well as suicidal ideation.  We will encourage her to attend groups.  She will be encouraged to work on her coping skills.  No medications outside of the trazodone for now.  I certify that inpatient services furnished can reasonably be expected to improve the patient's condition.   Antonieta Pert, MD 08/19/2017, 10:14 AM

## 2017-08-19 NOTE — B[facility] Group Notes (Signed)
[facility] Group Notes:  (Nursing)  Date:  08/19/2017  Time: 1:15 PM Type of Therapy:  Nurse Education  Participation Level:  Did Not Attend    Shela NevinValerie S Aarsh Fristoe 08/19/2017, 6:05 PM

## 2017-08-19 NOTE — H&P (Signed)
Psychiatric Admission Assessment Adult  Patient Identification: [name] [name] MRN:  [id number] Date of Evaluation:  08/19/2017 Chief Complaint:  MDD,rec,sev Cocaine Use disorder Principal Diagnosis: <principal problem not specified> Diagnosis:   Patient Active Problem List   Diagnosis Date Noted  . MDD (major depressive disorder), severe ([place]) [F32.2] 08/18/2017  . Anxiety and depression [F41.9, F32.9] 08/16/2017  . Overdose, accidental or unintentional, initial encounter [T50.901A] 08/16/2017  . Encephalopathy acute [G93.40] 08/16/2017  . QT prolongation [R94.31] 08/16/2017  . Substance abuse ([place]) [F19.10] 08/16/2017  . Overdose [T50.901A] 08/16/2017  . Stress and adjustment reaction [F43.29] 02/17/2013  . SOB (shortness of breath) [R06.02] 02/17/2013  . Panic anxiety syndrome [F41.0] 02/17/2013  . 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 female who was brought to [place] by EMS on 08-16-2017 after overdosing on Trazodone.  The Patient was not alert enough to participate in a tele-assessment until 08-18-2017.  Patient presented orientated x3, mood "anxious and depressed", affect congruent with mood, Patient denied current SI, HI, AVH.  Patient denied taking 20 Trazodone tablets on 08-16-2017.  She reports not being able to sleep for several days and decided to take 3 to 4 Trazodone tablets versus the 1 tablet prescribed in order to get to sleep.  Patient reports a suicide attempt in 2015 after attempting to cut her throat.  She reports being hospitalized for several days, however unable to recall hospital name.  She also reports several more psychiatric hospitalizations for "Bipolar" diagnosis, however declined to name dates and locations of the hospitalizations.  The Patient reports chronic anxiety and depression.  She reports feeling depressed and very anxious about  being incarcerated for several  years and losing custody of minor child to her Mother, her child dying in 6429, 68 year old Son is currently addicted to heroin, and currently being unemployed and homeless.  The Patient reports snorting powdered cocaine 1x per week and would not disclose the amount.  She reports smoking Cannabis daily of a joint or bowl to reduce anxiety.  Patient reports smoking Cannabis since age of 36 years.  Patient's UDS was positive for amphetamines also, however she denied usage.  Patient denied ever receiving substance use treatment.  Patient reports being unable to afford outpatient mental health treatment and would not say who or where receiving Trazodone.  Patient was pulling at monitoring equipment and stating "I want to leave" at the end of the tele-assessment  Evaluation: [name] [name] is awake and alert and oriented x3.  Patient is requesting to be discharged reports she did not attempt to overdose on trazodone intentionally.  Reports she only took 2-3 trazodone's to help get some rest.  States her boyfriend is lying on her.  Denies that she is suicidal or homicidal during this assessment.  Patient was evaluated by attending psychiatrist and NP.  Reports she has not been followed by a psychiatrist or substance abuse counselor in the past as she does not feel that she has a problem.  UDS positive for methamphetamines, cocaine, marijuana.  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  Patient validates the information provided in the above assessment. support encouragement reassurance was provided.   Associated Signs/Symptoms: Depression Symptoms:  depressed mood, feelings of worthlessness/guilt, difficulty concentrating, (Hypo) Manic Symptoms:  Distractibility, Impulsivity, Irritable Mood, Anxiety Symptoms:  Excessive Worry, Psychotic Symptoms:  Hallucinations: None PTSD Symptoms: Had a traumatic exposure:   She reports feeling depressed and very  anxious about  being incarcerated for several years and losing custody  of minor child to her Mother, her child dying in 9065, 75 year old Son is currently addicted to heroin Total Time spent with patient: 15 minutes  Past Psychiatric History:   Is the patient at risk to self? Yes.    Has the patient been a risk to self in the past 6 months? Yes.    Has the patient been a risk to self within the distant past? No.  Is the patient a risk to others? No.  Has the patient been a risk to others in the past 6 months? No.  Has the patient been a risk to others within the distant past? No.   Prior Inpatient Therapy:   Prior Outpatient Therapy:    Alcohol Screening: 1. How often do you have a drink containing alcohol?: Monthly or less 2. How many drinks containing alcohol do you have on a typical day when you are drinking?: 7, 8, or 9 3. How often do you have six or more drinks on one occasion?: Less than monthly AUDIT-C Score: 5 4. How often during the last year have you found that you were not able to stop drinking once you had started?: Less than monthly 5. How often during the last year have you failed to do what was normally expected from you becasue of drinking?: Never 6. How often during the last year have you needed a first drink in the morning to get yourself going after a heavy drinking session?: Never 7. How often during the last year have you had a feeling of guilt of remorse after drinking?: Never 8. How often during the last year have you been unable to remember what happened the night before because you had been drinking?: Never 9. Have you or someone else been injured as a result of your drinking?: No 10. Has a relative or friend or a doctor or another health worker been concerned about your drinking or suggested you cut down?: No Alcohol Use Disorder Identification Test Final Score (AUDIT): 6 Intervention/Follow-up: Alcohol Education Substance Abuse History in the last 12 months:  Yes.   Consequences of Substance Abuse: NA Previous Psychotropic  Medications: Yes  Psychological Evaluations: No  Past Medical History:  Past Medical History:  Diagnosis Date  . Anxiety   . Depression   . Hyperlipidemia   . Substance abuse ([place])    benzos, cocaine, opiates    Past Surgical History:  Procedure Laterality Date  . CESAREAN SECTION    . TUBAL LIGATION     Family History:  Family History  Problem Relation Age of Onset  . Hypertension Father   . Cancer Paternal Aunt        BREAST  . COPD Paternal Grandfather   . Hyperlipidemia Paternal Grandfather   . Hypertension Paternal Grandfather    Family Psychiatric  History: Tobacco Screening: Have you used any form of tobacco in the last 30 days? (Cigarettes, Smokeless Tobacco, Cigars, and/or Pipes): No Social History:  Social History   Substance and Sexual Activity  Alcohol Use Yes     Social History   Substance and Sexual Activity  Drug Use Yes  . Types: Marijuana, Cocaine    Additional Social History: Marital status: Long term relationship Long term relationship, how long?: 5-6 months What types of issues is patient dealing with in the relationship?: None - they do fuss with each other sometimes, normal.  States when her children's father gets out  of prison, she will go back to him. Additional relationship information: Was in a relationship with children's father for 20 years.  He is currently in prison, 7th time. Are you sexually active?: Yes What is your sexual orientation?: Straight Has your sexual activity been affected by drugs, alcohol, medication, or emotional stress?: No Does patient have children?: Yes How many children?: 3 How is patient's relationship with their children?: 1 child died in 28 at age 9 months, and she refuses to "bring this up again," 56yo son and 34yo daughter.  States she has a good relationship with both, does not have custody of either child because does not have her own place and their father is in prison.  Daughter was just in hospital.  Son  is using some drugs.                         Allergies:  No Known Allergies Lab Results:  Results for orders placed or performed during the hospital encounter of 08/16/17 (from the past 48 hour(s))  CBC     Status: Abnormal   Collection Time: 08/18/17  7:25 AM  Result Value Ref Range   WBC 10.1 4.0 - 10.5 K/uL   RBC 3.56 (L) 3.87 - 5.11 MIL/uL   Hemoglobin 8.6 (L) 12.0 - 15.0 g/dL   HCT 28.0 (L) 36.0 - 46.0 %   MCV 78.7 78.0 - 100.0 fL   MCH 24.2 (L) 26.0 - 34.0 pg   MCHC 30.7 30.0 - 36.0 g/dL   RDW 17.0 (H) 11.5 - 15.5 %   Platelets 352 150 - 400 K/uL    Comment: Performed at [facility], [address], [place], [place] [address]  Basic metabolic panel     Status: Abnormal   Collection Time: 08/18/17  7:25 AM  Result Value Ref Range   Sodium 140 135 - 145 mmol/L   Potassium 3.4 (L) 3.5 - 5.1 mmol/L   Chloride 109 98 - 111 mmol/L    Comment: Please note change in reference range.   CO2 25 22 - 32 mmol/L   Glucose, Bld 105 (H) 70 - 99 mg/dL    Comment: Please note change in reference range.   BUN 8 6 - 20 mg/dL    Comment: Please note change in reference range.   Creatinine, Ser 0.66 0.44 - 1.00 mg/dL   Calcium 8.1 (L) 8.9 - 10.3 mg/dL   GFR calc non Af Amer >60 >60 mL/min   GFR calc Af Amer >60 >60 mL/min    Comment: (NOTE) The eGFR has been calculated using the CKD EPI equation. This calculation has not been validated in all clinical situations. eGFR's persistently <60 mL/min signify possible Chronic Kidney Disease.    Anion gap 6 5 - 15    Comment: Performed at [facility], [address] [place], [place] [address]  Magnesium     Status: None   Collection Time: 08/18/17  7:25 AM  Result Value Ref Range   Magnesium 1.7 1.7 - 2.4 mg/dL    Comment: Performed at [facility], [address], [place], [place] [address]    Blood Alcohol level:  Lab Results  Component Value Date   [facility] <10 08/16/2017   ETH <11 86/76/7209    Metabolic Disorder Labs:  No  results found for: HGBA1C, MPG No results found for: PROLACTIN Lab Results  Component Value Date   CHOL 202 (H) 07/17/2012   TRIG 99 07/17/2012  HDL 66 07/17/2012   LDLCALC 116 (H) 07/17/2012    Current Medications: Current Facility-Administered Medications  Medication Dose Route Frequency Provider Last Rate Last Dose  . acetaminophen (TYLENOL) tablet 650 mg  650 mg Oral Q6H PRN Money, Lowry Ram, FNP   650 mg at 08/18/17 1723  . alum & mag hydroxide-simeth (MAALOX/MYLANTA) 200-200-20 MG/5ML suspension 30 mL  30 mL Oral Q4H PRN Money, Darnelle Maffucci B, FNP      . doxycycline (VIBRA-TABS) tablet 100 mg  100 mg Oral Q12H Money, Darnelle Maffucci B, FNP   100 mg at 08/19/17 0850  . hydrOXYzine (ATARAX/VISTARIL) tablet 25 mg  25 mg Oral TID PRN Money, Lowry Ram, FNP   25 mg at 08/18/17 2111  . magnesium hydroxide (MILK OF MAGNESIA) suspension 30 mL  30 mL Oral Daily PRN Money, Lowry Ram, FNP      . traZODone (DESYREL) tablet 50 mg  50 mg Oral QHS PRN,MR X 1 Simon, Spencer E, PA-C       PTA Medications: Medications Prior to Admission  Medication Sig Dispense Refill Last Dose  . doxycycline (VIBRA-TABS) 100 MG tablet Take 1 tablet (100 mg total) by mouth every 12 (twelve) hours for 7 days. 14 tablet 0     Musculoskeletal: Strength & Muscle Tone: within normal limits Gait & Station: normal Patient leans: N/A  Psychiatric Specialty Exam: Physical Exam  Vitals reviewed. Constitutional: She is oriented to person, place, and time. She appears well-developed.  Cardiovascular: Normal rate.  Neurological: She is alert and oriented to person, place, and time.  Psychiatric: She has a normal mood and affect. Her behavior is normal.    Review of Systems  Psychiatric/Behavioral: Positive for depression. The patient is nervous/anxious.   All other systems reviewed and are negative.   Blood pressure (!) 119/97, pulse 97, temperature 99.9 F (37.7 C), temperature source Oral, resp. rate 16, height _0  (1.702 m),  weight 74.4 kg (164 lb), SpO2 100 %.Body mass index is 25.69 kg/m.  General Appearance: Casual and Disheveled  Eye Contact:  Fair  Speech:  Clear and Coherent  Volume:  Normal  Mood:  Anxious and Depressed  Affect:  Congruent  Thought Process:  Coherent  Orientation:  Full (Time, Place, and Person)  Thought Content:  Hallucinations: None  Suicidal Thoughts:  No  Homicidal Thoughts:  No  Memory:  Immediate;   Fair Recent;   Fair Remote;   Fair  Judgement:  Fair  Insight:  Fair  Psychomotor Activity:  Normal and Restlessness  Concentration:  Concentration: Fair  Recall:  [employer] of Knowledge:  Fair  Language:  Fair  Akathisia:  No  Handed:  Right  AIMS (if indicated):     Assets:  Communication Skills Desire for Improvement Resilience Social Support  ADL's:  Intact  Cognition:  WNL  Sleep:  Number of Hours: 6    Treatment Plan Summary: Daily contact with patient to assess and evaluate symptoms and progress in treatment and Medication management   Insomnia and depression:  Continue with Trazodone 100 mg for insomnia  Will continue to monitor vitals ,medication compliance and treatment side effects while patient is here.  Reviewed labs: low potassium 3.4 replaced with k-dur 53mq ,BAL -, UDS - pos for cocaine, thc and amphetamines   CSW will start working on disposition.  Patient to participate in therapeutic milieu  Observation Level/Precautions:  15 minute checks  Laboratory:  CBC Chemistry Profile HbAIC UDS  Psychotherapy:  individual and group session  Medications:  See SRA  Consultations:  CSW and Psychiatry   Discharge Concerns:  Safety, stabilization, and risk of access to medication and medication stabilization   Estimated LOS:5-7days  Other:     Physician Treatment Plan for Primary Diagnosis: <principal problem not specified> Long Term Goal(s): Improvement in symptoms so as ready for discharge  Short Term Goals: Ability to identify changes in  lifestyle to reduce recurrence of condition will improve, Ability to demonstrate self-control will improve and Compliance with prescribed medications will improve  Physician Treatment Plan for Secondary Diagnosis: Active Problems:   MDD (major depressive disorder), severe ([place])  Long Term Goal(s): Improvement in symptoms so as ready for discharge  Short Term Goals: Ability to demonstrate self-control will improve, Ability to identify and develop effective coping behaviors will improve and Ability to identify triggers associated with substance abuse/mental health issues will improve  I certify that inpatient services furnished can reasonably be expected to improve the patient's condition.    Derrill Center, NP 7/7/20192:01 PM

## 2017-08-20 MED ORDER — TRAZODONE HCL 50 MG PO TABS: 50.0000 mg | ORAL_TABLET | Freq: Every evening | ORAL | 0 refills | Status: AC | PRN

## 2017-08-20 MED ORDER — DOXYCYCLINE HYCLATE 100 MG PO TABS: 100.0000 mg | ORAL_TABLET | Freq: Two times a day (BID) | ORAL | 0 refills | Status: DC

## 2017-08-20 MED ORDER — HYDROXYZINE HCL 25 MG PO TABS: 25.0000 mg | ORAL_TABLET | Freq: Three times a day (TID) | ORAL | 0 refills | Status: AC | PRN

## 2017-08-20 NOTE — Progress Notes (Signed)
D.  Pt pleasant on approach, states she wishes to discharge tomorrow.  Pt was positive for evening AA group, observed appropriately interacting with peers on the unit.  Pt denies SI/HI/AVH at this time.  A.  Support and encouragement offered, medication given as ordered  R.  Pt remains safe on the unit, will continue to monitor.

## 2017-08-20 NOTE — Progress Notes (Signed)
Recreation Therapy Notes  Date: 7.8.19 Time: 0930 Location: [address] Dayroom  Group Topic: Stress Management  Goal Area(s) Addresses:  Patient will verbalize importance of using healthy stress management.  Patient will identify positive emotions associated with healthy stress management.   Intervention: Stress Management  Activity :  Meditation.  LRT introduced the stress management technique of meditation.  LRT played a meditation on choice.  Patients were to listen and follow along as meditation played.  Education:  Stress Management, Discharge Planning.   Education Outcome: Acknowledges edcuation/In group clarification offered/Needs additional education  Clinical Observations/Feedback:  Pt did not attend group.     Caroll RancherMarjette Tenzin Edelman, LRT/CTRS         Lillia AbedLindsay, Akira Adelsberger A 08/20/2017 11:48 AM

## 2017-08-20 NOTE — Progress Notes (Signed)
  [facility] :  Will you be returning to the same living situation after discharge:  Yes,  home At discharge, do you have transportation home?: Yes,  family member Do you have the ability to pay for your medications: Yes,  mental health  Release of information consent forms completed and submitted to medical records by CSW.  Patient to Follow up at: Follow-up Information    [facility] Follow up on 08/22/2017.   Why:  Hospital follow-up on Wed, 08/22/17 at 9:00AM. Please bring: proof of income if any. Thank you.  Contact information: [address] [place] [id number] [phone]           Next level of care provider [facility] Link:no  Safety Planning and Suicide Prevention discussed: Yes,  SPE completed with pt; pt declined to consent to collateral contact. SPI pamphlet and Mobile Crisis information provided to pt.   Have you used any form of tobacco in the last 30 days? (Cigarettes, Smokeless Tobacco, Cigars, and/or Pipes): No  Has patient been referred to the Quitline?: N/A patient is not a smoker  Patient has been referred for addiction treatment: Yes  Rona RavensHeather S Tensley Wery, LCSW 08/20/2017, 9:56 AM

## 2017-08-20 NOTE — Progress Notes (Signed)
D:  Patient's self inventory sheet, patient has poor sleep, no sleep medication.  Fair appetite, normal energy level, good concentration.  Denied withdrawals.  Denied SI.  Denied physical pain.  Goal is discharge. A:  Medications administered per MD orders.  Emotional support and encouragement given patient. R:  Denied SI and HI, contracts for safety.  Denied A/V hallucinations.  Safety maintained with 15 minute checks. Patient is ready for discharge.

## 2017-08-20 NOTE — [facility] Suicide Risk Assessment (Signed)
[facility]   Principal Problem: <principal problem not specified> Discharge Diagnoses:  Patient Active Problem List   Diagnosis Date Noted  . MDD (major depressive disorder), severe (HCC) [F32.2] 08/18/2017  . Anxiety and depression [F41.9, F32.9] 08/16/2017  . Overdose, accidental or unintentional, initial encounter [T50.901A] 08/16/2017  . Encephalopathy acute [G93.40] 08/16/2017  . QT prolongation [R94.31] 08/16/2017  . Substance abuse (HCC) [F19.10] 08/16/2017  . Overdose [T50.901A] 08/16/2017  . Stress and adjustment reaction [F43.29] 02/17/2013  . SOB (shortness of breath) [R06.02] 02/17/2013  . Panic anxiety syndrome [F41.0] 02/17/2013  . 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   Blood pressure (!) 129/99, pulse 84, temperature 99.9 F (37.7 C), temperature source Oral, resp. rate 16, height 5\' 7"  (1.702 m), weight 74.4 kg (164 lb), SpO2 100 %.Body mass index is 25.69 kg/m.  General Appearance: Casual  Eye Contact::  Good  Speech:  Normal Rate409  Volume:  Normal  Mood:  Euthymic  Affect:  Appropriate  Thought Process:  Coherent  Orientation:  Full (Time, Place, and Person)  Thought Content:  Logical  Suicidal Thoughts:  No  Homicidal Thoughts:  No  Memory:  Immediate;   Fair Recent;   Fair Remote;   Fair  Judgement:  Intact  Insight:  Fair  Psychomotor Activity:  Normal  Concentration:  Good  Recall:  Fair  Fund of Knowledge:Good  Language: Good  Akathisia:  Negative  Handed:  Right  AIMS (if indicated):     Assets:  Communication Skills Desire for Improvement Housing Physical Health Resilience  Sleep:  Number of Hours: 5.25  Cognition: WNL  ADL's:   Intact   Mental Status Per Nursing Assessment::   On Admission:  NA  Demographic Factors:  Caucasian and Low socioeconomic status  Loss Factors: NA  Historical Factors: Impulsivity  Risk Reduction Factors:   Responsible for children under 37 years of age, Sense of responsibility to family and Living with another person, especially a relative  Continued Clinical Symptoms:  Depression:   Impulsivity  Cognitive Features That Contribute To Risk:  None    Suicide Risk:  Minimal: No identifiable suicidal ideation.  Patients presenting with no risk factors but with morbid ruminations; may be classified as minimal risk based on the severity of the depressive symptoms  Follow-up Information    [facility] Follow up on 08/22/2017.   Why:  Hospital follow-up on Wed, 08/22/17 at 9:00AM. Please bring: proof of income if any. Thank you.  Contact information: [address] [place] [id number] [phone]           Plan Of Care/Follow-up recommendations:  Activity:  ad lib  Antonieta PertGreg Lawson Omolola Mittman, MD 08/20/2017, 10:38 AM

## 2017-08-20 NOTE — Tx Team (Signed)
Interdisciplinary Treatment and Diagnostic Plan Update  08/20/2017 Time of Session: 0830AM [name] [name] MRN: [id number]  Principal Diagnosis: MDD, recurrent, severe  Secondary Diagnoses: Active Problems:   MDD (major depressive disorder), severe (HCC)   Current Medications:  Current Facility-Administered Medications  Medication Dose Route Frequency Provider Last Rate Last Dose  . acetaminophen (TYLENOL) tablet 650 mg  650 mg Oral Q6H PRN Money, Gerlene Burdockravis B, FNP   650 mg at 08/19/17 1824  . alum & mag hydroxide-simeth (MAALOX/MYLANTA) 200-200-20 MG/5ML suspension 30 mL  30 mL Oral Q4H PRN Money, Feliz Beamravis B, FNP   30 mL at 08/19/17 1525  . doxycycline (VIBRA-TABS) tablet 100 mg  100 mg Oral Q12H Money, Gerlene Burdockravis B, FNP   100 mg at 08/20/17 [id number]  . hydrOXYzine (ATARAX/VISTARIL) tablet 25 mg  25 mg Oral TID PRN Money, Gerlene Burdockravis B, FNP   25 mg at 08/19/17 2210  . magnesium hydroxide (MILK OF MAGNESIA) suspension 30 mL  30 mL Oral Daily PRN Money, Gerlene Burdockravis B, FNP      . traZODone (DESYREL) tablet 50 mg  50 mg Oral QHS PRN,MR X 1 Simon, Spencer E, PA-C       PTA Medications: Medications Prior to Admission  Medication Sig Dispense Refill Last Dose  . doxycycline (VIBRA-TABS) 100 MG tablet Take 1 tablet (100 mg total) by mouth every 12 (twelve) hours for 7 days. 14 tablet 0     Patient Stressors: Paediatric nurseinancial difficulties Legal issue  Patient Strengths: Average or above average intelligence Capable of independent living Physical Health  Treatment Modalities: Medication Management, Group therapy, Case management,  1 to 1 session with clinician, Psychoeducation, Recreational therapy.   Physician Treatment Plan for Primary Diagnosis: MDD, recurrent, severe Long Term Goal(s): Improvement in symptoms so as ready for discharge Improvement in symptoms so as ready for discharge   Short Term Goals: Ability to identify changes in lifestyle to reduce recurrence of condition will improve Ability to demonstrate  self-control will improve Compliance with prescribed medications will improve Ability to demonstrate self-control will improve Ability to identify and develop effective coping behaviors will improve Ability to identify triggers associated with substance abuse/mental health issues will improve  Medication Management: Evaluate patient's response, side effects, and tolerance of medication regimen.  Therapeutic Interventions: 1 to 1 sessions, Unit Group sessions and Medication administration.  Evaluation of Outcomes: Adequate for Discharge  Physician Treatment Plan for Secondary Diagnosis: Active Problems:   MDD (major depressive disorder), severe (HCC)  Long Term Goal(s): Improvement in symptoms so as ready for discharge Improvement in symptoms so as ready for discharge   Short Term Goals: Ability to identify changes in lifestyle to reduce recurrence of condition will improve Ability to demonstrate self-control will improve Compliance with prescribed medications will improve Ability to demonstrate self-control will improve Ability to identify and develop effective coping behaviors will improve Ability to identify triggers associated with substance abuse/mental health issues will improve     Medication Management: Evaluate patient's response, side effects, and tolerance of medication regimen.  Therapeutic Interventions: 1 to 1 sessions, Unit Group sessions and Medication administration.  Evaluation of Outcomes: Adequate for Discharge   RN Treatment Plan for Primary Diagnosis: MDD, recurrent, severe Long Term Goal(s): Knowledge of disease and therapeutic regimen to maintain health will improve  Short Term Goals: Ability to remain free from injury will improve, Ability to demonstrate self-control and Ability to disclose and discuss suicidal ideas  Medication Management: RN will administer medications as ordered by provider, will assess and  evaluate patient's response and provide  education to patient for prescribed medication. RN will report any adverse and/or side effects to prescribing provider.  Therapeutic Interventions: 1 on 1 counseling sessions, Psychoeducation, Medication administration, Evaluate responses to treatment, Monitor vital signs and CBGs as ordered, Perform/monitor CIWA, COWS, AIMS and Fall Risk screenings as ordered, Perform wound care treatments as ordered.  Evaluation of Outcomes: Adequate for Discharge   LCSW Treatment Plan for Primary Diagnosis: MDD, recurrent, severe Long Term Goal(s): Safe transition to appropriate next level of care at discharge, Engage patient in therapeutic group addressing interpersonal concerns.  Short Term Goals: Engage patient in aftercare planning with referrals and resources, Facilitate patient progression through stages of change regarding substance use diagnoses and concerns and Identify triggers associated with mental health/substance abuse issues  Therapeutic Interventions: Assess for all discharge needs, 1 to 1 time with Social worker, Explore available resources and support systems, Assess for adequacy in community support network, Educate family and significant other(s) on suicide prevention, Complete Psychosocial Assessment, Interpersonal group therapy.  Evaluation of Outcomes: Adequate for Discharge   Progress in Treatment: Attending groups: Yes. Participating in groups: Yes. Taking medication as prescribed: Yes. Toleration medication: Yes. Family/Significant other contact made:SPE completed with pt. Pt declined to consent to collateral contact.  Patient understands diagnosis: Yes. However, pt minimizes her symptoms/substance abuse.  Discussing patient identified problems/goals with staff: Yes. Medical problems stabilized or resolved: Yes. Denies suicidal/homicidal ideation: Yes. Issues/concerns per patient self-inventory: No. Other: n/a   New problem(s) identified: No, Describe:  n/a  New Short  Term/Long Term Goal(s): detox, medication management for mood stabilization; elimination of SI thoughts; development of comprehensive mental wellness/sobriety plan.   Patient Goals:  "To leave and get to court and work."   Discharge Plan or Barriers: Pt plans to return home with boyfriend. Follow-up with [facility]. MHAG pamphlet, Mobile Crisis information, and AA/NA information provided to patient for additional community support and resources.   Reason for Continuation of Hospitalization: none  Estimated Length of Stay: Monday, 08/20/17  Attendees: Patient: [name] [name] 08/20/2017 8:53 AM  Physician: Dr. Jola Babinski MD; Dr. Viviano Simas MD 08/20/2017 8:53 AM  Nursing: Lincoln Maxin RN; [facility] RN 08/20/2017 8:53 AM  RN Care Manager:x 08/20/2017 8:53 AM  Social Worker: Corrie Mckusick LCSW 08/20/2017 8:53 AM  Recreational Therapist: x 08/20/2017 8:53 AM  Other: Gilda Crease NP 08/20/2017 8:53 AM  Other:  08/20/2017 8:53 AM  Other: 08/20/2017 8:53 AM    Scribe for Treatment Team: Rona Ravens, LCSW 08/20/2017 8:53 AM

## 2017-08-20 NOTE — Discharge Summary (Signed)
Physician Discharge Summary Note  Patient:  [name] [name] is an 37 y.o., female MRN:  [id number] DOB:  04-23-1980 Patient phone:  [phone] (home)  Patient address:   [address] [place] [address],  Total Time spent with patient: 30 minutes  Date of Admission:  08/18/2017 Date of Discharge: 08/20/2017  Reason for Admission:  per assessment note:[name] [name] Handis an 37 y.o.femalewho was brought to [facility] by EMS on 08-16-2017 after overdosing on Trazodone. The Patient was not alert enough to participate in a tele-assessment until 08-18-2017. Patient presented orientated x3, mood "anxious and depressed", affect congruent with mood, Patient denied current SI, HI, AVH. Patient denied taking 20 Trazodone tablets on 08-16-2017. She reports not being able to sleep for several days and decided to take 3 to 4 Trazodone tablets versus the 1 tablet prescribed in order to get to sleep. Patient reports a suicide attempt in 2015 after attempting to cut her throat. She reports being hospitalized for several days, however unable to recall hospital name. She also reports several more psychiatric hospitalizations for "Bipolar" diagnosis, however declined to name dates and locations of the hospitalizations. The Patient reports chronic anxiety and depression. She reports feeling depressed and very anxious about being incarcerated for several years and losing custody of minor child to her Mother, her child dying in 7118, 2 year old Son is currently addicted to heroin, and currently being unemployed and homeless. The Patient reports snorting powdered cocaine 1x per week and would not disclose the amount. She reports smoking Cannabis daily of a joint or bowl to reduce anxiety. Patient reports smoking Cannabis since age of 78 years. Patient's UDS was positive for amphetamines also, however she denied usage. Patient denied ever receiving substance use treatment. Patient reports being unable to afford  outpatient mental health treatment and would not say who or where receiving Trazodone. Patient was pulling at monitoring equipment and stating "I want to leave" at the end of the tele-assessment  Evaluation: Collene is awake and alert and oriented x3.  Patient is requesting to be discharged reports she did not attempt to overdose on trazodone intentionally.  Reports she only took 2-3 trazodone's to help get some rest.  States her boyfriend is lying on her.  Denies that she is suicidal or homicidal during this assessment.  Patient was evaluated by attending psychiatrist and NP.  Reports she has not been followed by a psychiatrist or substance abuse counselor in the past as she does not feel that she has a problem.  UDS positive for methamphetamines, cocaine, marijuana.  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  Patient validates the information provided in the above assessment. support encouragement reassurance was provided.   Associated Signs/Symptoms: Depression Symptoms:  depressed mood, feelings of worthlessness/guilt, difficulty concentrating, (Hypo) Manic Symptoms:  Distractibility, Impulsivity, Irritable Mood, Anxiety Symptoms:  Excessive Worry, Psychotic Symptoms:  Hallucinations: None PTSD Symptoms: Had a traumatic exposure:   She reports feeling depressed and very anxious about  being incarcerated for several years and losing custody of minor child to her Mother, her child dying in 3224, 41 year old Son is currently addicted to heroin    Principal Problem: <principal problem not specified> Discharge Diagnoses: Patient Active Problem List   Diagnosis Date Noted  . MDD (major depressive disorder), severe (HCC) [F32.2] 08/18/2017  . Anxiety and depression [F41.9, F32.9] 08/16/2017  . Overdose, accidental or unintentional, initial encounter [T50.901A] 08/16/2017  . Encephalopathy acute [G93.40] 08/16/2017  . QT prolongation [R94.31] 08/16/2017  .  Substance abuse (HCC) [F19.10]  08/16/2017  . Overdose [T50.901A] 08/16/2017  . Stress and adjustment reaction [F43.29] 02/17/2013  . SOB (shortness of breath) [R06.02] 02/17/2013  . Panic anxiety syndrome [F41.0] 02/17/2013  . 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 Anxiety   . Depression   . Hyperlipidemia   . Substance abuse (HCC)    benzos, cocaine, opiates    Past Surgical History:  Procedure Laterality Date  . CESAREAN SECTION    . TUBAL LIGATION     Family History:  Family History  Problem Relation Age of Onset  . Hypertension Father   . Cancer Paternal Aunt        BREAST  . COPD Paternal Grandfather   . Hyperlipidemia Paternal Grandfather   . Hypertension Paternal Grandfather    Family Psychiatric  History: Denies Social History:  Social History   Substance and Sexual Activity  Alcohol Use Yes     Social History   Substance and Sexual Activity  Drug Use Yes  . Types: Marijuana, Cocaine    Social History   Socioeconomic History  . Marital status: Single    Spouse name: Not on file  . Number of children: Not on file  . Years of education: Not on file  . Highest education level: Not on file  Occupational History  . Not on file  Social Needs  . Financial resource strain: Not on file  . Food insecurity:    Worry: Not on file    Inability: Not on file  . Transportation needs:    Medical: Not on file    Non-medical: Not on file  Tobacco Use  . Smoking status: Former Smoker    Packs/day: 1.00    Types: Cigarettes  . Smokeless tobacco: Never Used  Substance and Sexual Activity  . Alcohol use: Yes  . Drug use: Yes    Types: Marijuana, Cocaine  . Sexual activity: Yes    Birth control/protection: Surgical  Lifestyle  . Physical activity:    Days per week: Not on file    Minutes per session: Not on file  . Stress: Not on file   Relationships  . Social connections:    Talks on phone: Not on file    Gets together: Not on file    Attends religious service: Not on file    Active member of club or organization: Not on file    Attends meetings of clubs or organizations: Not on file    Relationship status: Not on file  Other Topics Concern  . Not on file  Social History Narrative  . Not on file    Hospital Course:  Andy Gaussmanda [name] Arambula was admitted for <principal problem not specified> and crisis management.  She was treated with the following medications trazodone 50 mg p.o nightly as needed for insomnia, hydroxyzine 25 mg p.o. 3 times daily as needed for anxiety.Andy Gauss.  Verdelle [name] Hegg was discharged with current medication and was instructed on how to take medications as prescribed; (details listed below under Medication List).  Medical problems were identified and treated as needed.  Home medications were restarted as appropriate.  Labs reviewed during admission were determined to be within normal with the exception of low potassium at 3.4 which was replaced with potassium 20 MDQ, UDS positive for cocaine, THC, and amphetamines.  Upon admission patient denied taking  overdose of 20 trazodone tablets, reports only taken 3-4 tablets and denies any history of depression or suicidal ideation she was monitored for brief period of time and discharged.  Improvement was monitored by observation and Marchelle Folks [name] Bonillas daily report of symptom reduction.  Emotional and mental status was monitored by daily self-inventory reports completed by [name] and clinical staff.         Andy Gauss Eplin was evaluated by the treatment team for stability and plans for continued recovery upon discharge.  Andy Gauss Heckman motivation was an integral factor for scheduling further treatment.  Employment, transportation, bed availability, health status, family support, and any pending legal issues were also considered during her hospital stay.  She was offered further  treatment options upon discharge including but not limited to Residential, Intensive Outpatient, and Outpatient treatment.  Andy Gauss Aikman will follow up with the services as listed below under Follow Up Information.     Upon completion of this admission the Anner [name] Danser was both mentally and medically stable for discharge denying suicidal/homicidal ideation, auditory/visual/tactile hallucinations, delusional thoughts and paranoia.      Physical Findings: AIMS: Facial and Oral Movements Muscles of Facial Expression: None, normal Lips and Perioral Area: None, normal Jaw: None, normal Tongue: None, normal,Extremity Movements Upper (arms, wrists, hands, fingers): None, normal Lower (legs, knees, ankles, toes): None, normal, Trunk Movements Neck, shoulders, hips: None, normal, Overall Severity Severity of abnormal movements (highest score from questions above): None, normal Incapacitation due to abnormal movements: None, normal Patient's awareness of abnormal movements (rate only patient's report): No Awareness, Dental Status Current problems with teeth and/or dentures?: No Does patient usually wear dentures?: No  CIWA:  CIWA-Ar Total: 1 COWS:  COWS Total Score: 2  Musculoskeletal: Strength & Muscle Tone: within normal limits Gait & Station: normal Patient leans: N/A  Psychiatric Specialty Exam:SEE MD DSRA Physical Exam  ROS  Blood pressure (!) 129/99, pulse 84, temperature 99.9 F (37.7 C), temperature source Oral, resp. rate 16, height 5\' 7"  (1.702 m), weight 74.4 kg (164 lb), SpO2 100 %.Body mass index is 25.69 kg/m.  Sleep:  Number of Hours: 5.25     Have you used any form of tobacco in the last 30 days? (Cigarettes, Smokeless Tobacco, Cigars, and/or Pipes): No  Has this patient used any form of tobacco in the last 30 days? (Cigarettes, Smokeless Tobacco, Cigars, and/or Pipes)  No  Blood Alcohol level:  Lab Results  Component Value Date   ETH <10 08/16/2017   ETH <11  10/31/2013    Metabolic Disorder Labs:  No results found for: HGBA1C, MPG No results found for: PROLACTIN Lab Results  Component Value Date   CHOL 202 (H) 07/17/2012   TRIG 99 07/17/2012   HDL 66 07/17/2012   LDLCALC 116 (H) 07/17/2012    See Psychiatric Specialty Exam and Suicide Risk Assessment completed by Attending Physician prior to discharge. Discharge destination:  Home  Is patient on multiple antipsychotic therapies at discharge:  No   Has Patient had three or more failed trials of antipsychotic monotherapy by history:  No  Recommended Plan for Multiple Antipsychotic Therapies: NA      Discharge Instructions    Discharge instructions   Complete by:  As directed    Please continue to take medications as directed. If your symptoms return, worsen, or persist please call your 911, report to local ER, or contact crisis hotline. Please do not drink alcohol or use any illegal substances while  taking prescription medications.         Discharge Instructions    Discharge instructions   Complete by:  As directed    Please continue to take medications as directed. If your symptoms return, worsen, or persist please call your 911, report to local ER, or contact crisis hotline. Please do not drink alcohol or use any illegal substances while taking prescription medications.     Allergies as of 08/20/2017   No Known Allergies     Medication List    TAKE these medications     Indication  doxycycline 100 MG tablet Commonly known as:  VIBRA-TABS Take 1 tablet (100 mg total) by mouth every 12 (twelve) hours.  Indication:  Infection caused by Bacteria   hydrOXYzine 25 MG tablet Commonly known as:  ATARAX/VISTARIL Take 1 tablet (25 mg total) by mouth 3 (three) times daily as needed for anxiety.  Indication:  Feeling Anxious, State of Being Sedated, Tension   traZODone 50 MG tablet Commonly known as:  DESYREL Take 1 tablet (50 mg total) by mouth at bedtime as needed  and may repeat dose one time if needed for sleep.  Indication:  Trouble Sleeping      Follow-up Information    Services, [facility] Follow up.   Contact information: [address] [place] [address] [phone]           Follow-up recommendations:  Activity:  Increase activity as tolerated. Diet:  Routine house diet as suggested by outpatient physician. Tests:  Routine test as determined by outpatient psychiatrist. Other:  Even if you began to feel better please continue to take your medication daily.    Signed: Truman Hayward, FNP 08/20/2017, 9:25 AM

## 2017-08-20 NOTE — Progress Notes (Signed)
Discharge Note:  Patient discharged with family.  Patient denied SI and HI.  Denied A/V hallucinations.  Suicide prevention information given and discussed with patient who stated she understood and had no questions.  Patient stated she received all her belongings.  Patient stated [facility].  All required discharge information given to patient at discharge.

## 2017-08-23 LAB — VITAMIN B12

## 2018-09-07 ENCOUNTER — Emergency Department (HOSPITAL_COMMUNITY): Payer: Self-pay

## 2018-09-07 ENCOUNTER — Other Ambulatory Visit: Payer: Self-pay

## 2018-09-07 ENCOUNTER — Encounter (HOSPITAL_COMMUNITY): Payer: Self-pay | Admitting: Emergency Medicine

## 2018-09-07 ENCOUNTER — Emergency Department (HOSPITAL_COMMUNITY)
Admission: EM | Admit: 2018-09-07 | Discharge: 2018-09-07 | Disposition: A | Discharge status: Home / Self Care | Payer: Self-pay | Attending: Emergency Medicine | Admitting: Emergency Medicine

## 2018-09-07 DIAGNOSIS — R1032 Left lower quadrant pain: Secondary | ICD-10-CM

## 2018-09-07 DIAGNOSIS — N739 Female pelvic inflammatory disease, unspecified: Secondary | ICD-10-CM | POA: Insufficient documentation

## 2018-09-07 DIAGNOSIS — N760 Acute vaginitis: Secondary | ICD-10-CM | POA: Insufficient documentation

## 2018-09-07 DIAGNOSIS — Z87891 Personal history of nicotine dependence: Secondary | ICD-10-CM | POA: Insufficient documentation

## 2018-09-07 DIAGNOSIS — B9689 Other specified bacterial agents as the cause of diseases classified elsewhere: Secondary | ICD-10-CM

## 2018-09-07 LAB — URINALYSIS, ROUTINE W REFLEX MICROSCOPIC
Bilirubin Urine: NEGATIVE
Glucose, UA: NEGATIVE mg/dL
Hgb urine dipstick: NEGATIVE
Ketones, ur: NEGATIVE mg/dL
Nitrite: NEGATIVE
Protein, ur: NEGATIVE mg/dL
Specific Gravity, Urine: 1.012 (ref 1.005–1.030)
pH: 7 (ref 5.0–8.0)

## 2018-09-07 LAB — COMPREHENSIVE METABOLIC PANEL
ALT: 13 U/L (ref 0–44)
AST: 16 U/L (ref 15–41)
Albumin: 3.8 g/dL (ref 3.5–5.0)
Alkaline Phosphatase: 43 U/L (ref 38–126)
Anion gap: 7 (ref 5–15)
BUN: 14 mg/dL (ref 6–20)
CO2: 24 mmol/L (ref 22–32)
Calcium: 8.6 mg/dL — ABNORMAL LOW (ref 8.9–10.3)
Chloride: 106 mmol/L (ref 98–111)
Creatinine, Ser: 0.66 mg/dL (ref 0.44–1.00)
GFR calc Af Amer: >60 mL/min (ref >60)
GFR calc non Af Amer: >60 mL/min (ref >60)
Glucose, Bld: 91 mg/dL (ref 70–99)
Potassium: 3.7 mmol/L (ref 3.5–5.1)
Sodium: 137 mmol/L (ref 135–145)
Total Bilirubin: 0.5 mg/dL (ref 0.3–1.2)
Total Protein: 7.3 g/dL (ref 6.5–8.1)

## 2018-09-07 LAB — CBC
HCT: 30.8 % — ABNORMAL LOW (ref 36.0–46.0)
Hemoglobin: 8.7 g/dL — ABNORMAL LOW (ref 12.0–15.0)
MCH: 20 pg — ABNORMAL LOW (ref 26.0–34.0)
MCHC: 28.2 g/dL — ABNORMAL LOW (ref 30.0–36.0)
MCV: 70.6 fL — ABNORMAL LOW (ref 80.0–100.0)
Platelets: 349 10*3/uL (ref 150–400)
RBC: 4.36 MIL/uL (ref 3.87–5.11)
RDW: 18.3 % — ABNORMAL HIGH (ref 11.5–15.5)
WBC: 6.9 10*3/uL (ref 4.0–10.5)
nRBC: 0 % (ref 0.0–0.2)

## 2018-09-07 LAB — WET PREP, GENITAL
Sperm: NONE SEEN
Trich, Wet Prep: NONE SEEN
Yeast Wet Prep HPF POC: NONE SEEN

## 2018-09-07 LAB — POC URINE PREG, ED: Preg Test, Ur: NEGATIVE

## 2018-09-07 LAB — LIPASE, BLOOD: Lipase: 29 U/L (ref 11–51)

## 2018-09-07 MED ORDER — SODIUM CHLORIDE 0.9 % IV BOLUS
1000.0000 mL | Freq: Once | INTRAVENOUS | Status: AC
  Administered 2018-09-07: 1000 mL via INTRAVENOUS

## 2018-09-07 MED ORDER — IOHEXOL 300 MG/ML  SOLN
100.0000 mL | Freq: Once | INTRAMUSCULAR | Status: AC | PRN
  Administered 2018-09-07: 100 mL via INTRAVENOUS

## 2018-09-07 MED ORDER — DOXYCYCLINE HYCLATE 100 MG PO CAPS: 100.0000 mg | ORAL_CAPSULE | Freq: Two times a day (BID) | ORAL | 0 refills | Status: AC

## 2018-09-07 MED ORDER — METRONIDAZOLE 500 MG PO TABS: 500.0000 mg | ORAL_TABLET | Freq: Two times a day (BID) | ORAL | 0 refills | Status: AC

## 2018-09-07 MED ORDER — MORPHINE SULFATE (PF) 4 MG/ML IV SOLN
4.0000 mg | Freq: Once | INTRAVENOUS | Status: AC
  Administered 2018-09-07: 4 mg via INTRAVENOUS
  Filled 2018-09-07: qty 1

## 2018-09-07 MED ORDER — STERILE WATER FOR INJECTION IJ SOLN
INTRAMUSCULAR | Status: AC
  Administered 2018-09-07: 0.9 mL
  Filled 2018-09-07: qty 10

## 2018-09-07 MED ORDER — IBUPROFEN 400 MG PO TABS
600.0000 mg | ORAL_TABLET | Freq: Once | ORAL | Status: AC
  Administered 2018-09-07: 600 mg via ORAL
  Filled 2018-09-07: qty 2

## 2018-09-07 MED ORDER — CEFTRIAXONE SODIUM 250 MG IJ SOLR
250.0000 mg | Freq: Once | INTRAMUSCULAR | Status: AC
  Administered 2018-09-07: 250 mg via INTRAMUSCULAR
  Filled 2018-09-07: qty 250

## 2018-09-07 NOTE — Discharge Instructions (Addendum)
You were seen in the ED today for pain to your left lower abdomen  You were found to have both bacterial vaginosis as well as pelvic inflammatory disease which is caused by STDs We have treated you in the ED with an antibiotic injection  Please also take the antibiotics as prescribed outpatient. It is very important that you finish all of the antibiotics  Please refrain from intercourse for the next 14 days until you finish your antibiotics Contact all sexual partners and let them know you are being treated and they should get tested  Please follow up with your PCP. If you do not have one you can follow up with [facility] for your primary care needs

## 2018-09-07 NOTE — ED Provider Notes (Signed)
[facility] EMERGENCY DEPARTMENT Provider Note   CSN: [id number] Arrival date & time: 09/07/18  1255    History   Chief Complaint Chief Complaint  Patient presents with   Abdominal Pain    HPI [name] Grayermanda K [name] is a 38 y.o. female with PMHx anxiety, depression, HLD, and substance abuse who presents to the ED complaining of sudden onset, constant, severe, sharp, LLQ abdominal pain radiating to left lower back that began last night. Pt reports the pain is so severe it kept her from sleeping all night. She took Motrin without relief. She reports hx of ruptured ovarian cyst when she was 17 and states this feels similar. Pt also has hx of kidney stone but is unable to remember if this feels similar. Denies fever, chills, nausea, vomiting, diarrhea, constipation, melena, hematochezia, dysuria, urinary frequency, pelvic pain, vaginal discharge, or any other associated symptoms. Pt LNMP 1.5 weeks ago. Denies risk of pregnancy, hx of tubal ligation.        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      Home Medications    Prior to Admission medications   Medication Sig Start Date End Date Taking? Authorizing  Provider  doxycycline (VIBRAMYCIN) 100 MG capsule Take 1 capsule (100 mg total) by mouth 2 (two) times daily for 14 days. 09/07/18 09/21/18  Tanda RockersVenter, [name] Kinnard, PA-C  hydrOXYzine (ATARAX/VISTARIL) 25 MG tablet Take 1 tablet (25 mg total) by mouth 3 (three) times daily as needed for anxiety. 08/20/17   Starkes-Perry, Juel Burrowakia S, FNP  metroNIDAZOLE (FLAGYL) 500 MG tablet Take 1 tablet (500 mg total) by mouth 2 (two) times daily. 09/07/18   Tanda RockersVenter, Briannie Gutierrez, PA-C  traZODone (DESYREL) 50 MG tablet Take 1 tablet (50 mg total) by mouth at bedtime as needed and may repeat dose one time if needed for sleep. 08/20/17   Maryagnes AmosStarkes-Perry, Takia S, FNP    Family History Family History  Problem Relation Age of Onset   Hypertension Father    Cancer Paternal Aunt        BREAST   COPD Paternal Grandfather    Hyperlipidemia Paternal Grandfather    Hypertension Paternal Grandfather     Social History Social History   Tobacco Use   Smoking status: Former Smoker    Packs/day: 1.00    Types: Cigarettes   Smokeless tobacco: Never Used  Substance Use Topics   Alcohol use: Yes   Drug use: Yes    Types: Marijuana, Cocaine     Allergies   Patient has no known allergies.   Review of Systems Review of Systems  Constitutional: Negative for chills and fever.  HENT: Negative for congestion.   Eyes: Negative  for visual disturbance.  Respiratory: Negative for cough and shortness of breath.   Cardiovascular: Negative for chest pain.  Gastrointestinal: Positive for abdominal pain. Negative for blood in stool, constipation, diarrhea, nausea and vomiting.  Genitourinary: Negative for dysuria, frequency, hematuria, pelvic pain, vaginal bleeding and vaginal discharge.  Musculoskeletal: Positive for back pain. Negative for myalgias.  Skin: Negative for rash.  Neurological: Negative for headaches.     Physical Exam Updated Vital Signs BP (!) 140/95 (BP Location: Right Arm)    Pulse 78    Temp 98.1 F (36.7 C)  (Oral)    Resp (!) 22    Ht 5\' 7"  (1.702 m)    Wt 68 kg    LMP 08/31/2018    SpO2 100%    BMI 23.49 kg/m   Physical Exam Vitals signs and nursing note reviewed.  Constitutional:      Appearance: She is not ill-appearing.     Comments: Tearful on exam  HENT:     Head: Normocephalic and atraumatic.  Eyes:     Conjunctiva/sclera: Conjunctivae normal.  Neck:     Musculoskeletal: Neck supple.  Cardiovascular:     Rate and Rhythm: Normal rate and regular rhythm.  Pulmonary:     Effort: Pulmonary effort is normal.     Breath sounds: Normal breath sounds. No wheezing, rhonchi or rales.  Abdominal:     General: Abdomen is flat. Bowel sounds are normal.     Palpations: Abdomen is soft.     Tenderness: There is abdominal tenderness in the left lower quadrant. There is no right CVA tenderness, left CVA tenderness, guarding or rebound. Negative signs include McBurney's sign.     Comments: Soft, tenderness to LLQ on exam, +BS throughout, no r/g/r, neg murphy's, neg mcburney's, no CVA TTP   Genitourinary:    Comments: Chaperone present for exam. No rashes, lesions, or tenderness to external genitalia. No erythema, injury, or tenderness to vaginal mucosa. No vaginal discharge or bleeding within vaginal vault. Patient has tenderness diffusely during bimanual exam to adnexa bilaterally and CMT. No obvious chandelier's sign.  Cervical os is closed. Uterus non-deviated, mobile, nonTTP, and without enlargement.   Musculoskeletal:     Comments: No C, T, or L midline spinal tenderness on exam  Skin:    General: Skin is warm and dry.  Neurological:     Mental Status: She is alert.      ED Treatments / Results  Labs (all labs ordered are listed, but only abnormal results are displayed) Labs Reviewed  WET PREP, GENITAL - Abnormal; Notable for the following components:      Result Value   Clue Cells Wet Prep HPF POC PRESENT (*)    WBC, Wet Prep HPF POC RARE (*)    All other components within  normal limits  COMPREHENSIVE METABOLIC PANEL - Abnormal; Notable for the following components:   Calcium 8.6 (*)    All other components within normal limits  CBC - Abnormal; Notable for the following components:   Hemoglobin 8.7 (*)    HCT 30.8 (*)    MCV 70.6 (*)    MCH 20.0 (*)    MCHC 28.2 (*)    RDW 18.3 (*)    All other components within normal limits  URINALYSIS, ROUTINE W REFLEX MICROSCOPIC - Abnormal; Notable for the following components:   APPearance CLOUDY (*)    Leukocytes,Ua SMALL (*)    All other components within normal limits  LIPASE, BLOOD  RPR  HIV  ANTIBODY (ROUTINE TESTING W REFLEX)  POC URINE PREG, ED  GC/CHLAMYDIA PROBE AMP ([place]) NOT AT [facility]    EKG None  Radiology US Transvaginal Non-ob  Result Date: 09/07/2018 CLINICAL DATA:  LEFT lower quadrant pain, history of ruptured ovarian cyst EXAM: TRANSABDOMINAL AND TRANSVAGINAL ULTRASOUND OF PELVIS DOPPLER ULTRASOUND OF OVARIES TECHNIQUE: Both transabdominal and transvaginal ultrasound examinations of the pelvis were performed. Transabdominal technique was performed for global imaging of the pelvis including uterus, ovaries, adnexal regions, and pelvic cul-de-sac. It was necessary to proceed with endovaginal exam following the transabdominal exam to visualize the endometrium and ovaries. Color and duplex Doppler ultrasound was utilized to evaluate blood flow to the ovaries. COMPARISON:  CT abdomen and pelvis 06/06/2015 FINDINGS: Uterus Measurements: 7.9 x 4.6 x 6.0 cm = volume: 115 mL. Anteverted. Anterior Caesarean section scar. Otherwise normal morphology without mass. Endometrium Thickness: 2 mm.  No endometrial fluid or focal abnormality Right ovary Measurements: 4.1 x 2.2 x 2.5 cm = volume: 11.8 mL. Normal morphology without mass period internal blood flow present on color Doppler imaging. Left ovary Measurements: 4.5 x 2.5 x 2.6 cm = volume: 14.8 mL. Dominant follicle without mass period internal blood flow  present on color Doppler imaging. Pulsed Doppler evaluation of both ovaries demonstrates normal low-resistance arterial and venous waveforms. Other findings No free pelvic fluid.  No adnexal masses. IMPRESSION: Normal exam. Electronically Signed   By: Ulyses Southward M.D.   On: 09/07/2018 15:07   US Pelvis Complete  Result Date: 09/07/2018 CLINICAL DATA:  LEFT lower quadrant pain, history of ruptured ovarian cyst EXAM: TRANSABDOMINAL AND TRANSVAGINAL ULTRASOUND OF PELVIS DOPPLER ULTRASOUND OF OVARIES TECHNIQUE: Both transabdominal and transvaginal ultrasound examinations of the pelvis were performed. Transabdominal technique was performed for global imaging of the pelvis including uterus, ovaries, adnexal regions, and pelvic cul-de-sac. It was necessary to proceed with endovaginal exam following the transabdominal exam to visualize the endometrium and ovaries. Color and duplex Doppler ultrasound was utilized to evaluate blood flow to the ovaries. COMPARISON:  CT abdomen and pelvis 06/06/2015 FINDINGS: Uterus Measurements: 7.9 x 4.6 x 6.0 cm = volume: 115 mL. Anteverted. Anterior Caesarean section scar. Otherwise normal morphology without mass. Endometrium Thickness: 2 mm.  No endometrial fluid or focal abnormality Right ovary Measurements: 4.1 x 2.2 x 2.5 cm = volume: 11.8 mL. Normal morphology without mass period internal blood flow present on color Doppler imaging. Left ovary Measurements: 4.5 x 2.5 x 2.6 cm = volume: 14.8 mL. Dominant follicle without mass period internal blood flow present on color Doppler imaging. Pulsed Doppler evaluation of both ovaries demonstrates normal low-resistance arterial and venous waveforms. Other findings No free pelvic fluid.  No adnexal masses. IMPRESSION: Normal exam. Electronically Signed   By: Ulyses Southward M.D.   On: 09/07/2018 15:07   Ct Abdomen Pelvis W Contrast  Result Date: 09/07/2018 CLINICAL DATA:  Abdominal pain. Suspect diverticulitis. EXAM: CT ABDOMEN AND PELVIS  WITH CONTRAST TECHNIQUE: Multidetector CT imaging of the abdomen and pelvis was performed using the standard protocol following bolus administration of intravenous contrast. CONTRAST:  OMNIPAQUE IOHEXOL 300 MG/ML  SOLN COMPARISON:  06/06/2015 FINDINGS: Lower chest: Lung bases are unremarkable. Hepatobiliary: No focal liver abnormality is seen. No radiopaque gallstones, biliary dilatation, or pericholecystic inflammatory changes. Pancreas: Unremarkable. No pancreatic ductal dilatation or surrounding inflammatory changes. Spleen: Normal in size without focal abnormality. Adrenals/Urinary Tract: Adrenal glands are unremarkable. Kidneys are normal, without renal calculi, focal lesion, or hydronephrosis. Bladder is unremarkable. Stomach/Bowel: The stomach  is normal in appearance. Small bowel loops are unremarkable. The appendix is well seen and has a normal appearance. Colon is unremarkable. Vascular/Lymphatic: No significant vascular findings are present. No enlarged abdominal or pelvic lymph nodes. Reproductive: The uterus is present. Fallopian tubes are noted to enhance bilaterally, LEFT greater than RIGHT, consistent with salpingitis. No adnexal mass. There is a small amount of free pelvic fluid. Other: Anterior abdominal wall is unremarkable. Musculoskeletal: Degenerative changes are identified at L5-S1. IMPRESSION: 1. Normal appendix. 2. Fallopian tubes enhance bilaterally, LEFT greater than RIGHT, consistent with salpingitis. 3. Small amount of free pelvic fluid. 4. Findings are consistent with pelvic inflammatory disease. Electronically Signed   By: Norva PavlovElizabeth  Brown M.D.   On: 09/07/2018 17:04   [place] Art/ven Flow Abd Pelv Doppler  Result Date: 09/07/2018 CLINICAL DATA:  LEFT lower quadrant pain, history of ruptured ovarian cyst EXAM: TRANSABDOMINAL AND TRANSVAGINAL ULTRASOUND OF PELVIS DOPPLER ULTRASOUND OF OVARIES TECHNIQUE: Both transabdominal and transvaginal ultrasound examinations of the pelvis were  performed. Transabdominal technique was performed for global imaging of the pelvis including uterus, ovaries, adnexal regions, and pelvic cul-de-sac. It was necessary to proceed with endovaginal exam following the transabdominal exam to visualize the endometrium and ovaries. Color and duplex Doppler ultrasound was utilized to evaluate blood flow to the ovaries. COMPARISON:  CT abdomen and pelvis 06/06/2015 FINDINGS: Uterus Measurements: 7.9 x 4.6 x 6.0 cm = volume: 115 mL. Anteverted. Anterior Caesarean section scar. Otherwise normal morphology without mass. Endometrium Thickness: 2 mm.  No endometrial fluid or focal abnormality Right ovary Measurements: 4.1 x 2.2 x 2.5 cm = volume: 11.8 mL. Normal morphology without mass period internal blood flow present on color Doppler imaging. Left ovary Measurements: 4.5 x 2.5 x 2.6 cm = volume: 14.8 mL. Dominant follicle without mass period internal blood flow present on color Doppler imaging. Pulsed Doppler evaluation of both ovaries demonstrates normal low-resistance arterial and venous waveforms. Other findings No free pelvic fluid.  No adnexal masses. IMPRESSION: Normal exam. Electronically Signed   By: Ulyses SouthwardMark  Boles M.D.   On: 09/07/2018 15:07    Procedures Procedures (including critical care time)  Medications Ordered in ED Medications  cefTRIAXone (ROCEPHIN) injection 250 mg (has no administration in time range)  sodium chloride 0.9 % bolus 1,000 mL (0 mLs Intravenous Stopped 09/07/18 1531)  morphine 4 MG/ML injection 4 mg (4 mg Intravenous Given 09/07/18 1405)  iohexol (OMNIPAQUE) 300 MG/ML solution 100 mL (100 mLs Intravenous Contrast Given 09/07/18 1636)     Initial Impression / Assessment and Plan / ED Course  I have reviewed the triage vital signs and the nursing notes.  Pertinent labs & imaging results that were available during my care of the patient were reviewed by me and considered in my medical decision making (see chart for  details).  Clinical Course as of Sep 07 1751  Sat Sep 07, 2018  1503 Appears to be pt's baseline  Hemoglobin(!): 8.7 [MV]  1503 No signs of blood to suggest kidney stone  Hgb urine dipstick: NEGATIVE [MV]  1604 WBC: 6.9 [MV]  1646 Clue Cells Wet Prep HPF POC(!): PRESENT [MV]    Clinical Course User Index [MV] Tanda RockersVenter, Adama Ferber, PA-C   38 year old female presenting to the ED today with sudden LLQ abdominal pain without nausea, vomiting, pelvic pain, vaginal discharge. Hx of ruptured ovarian cyst when pt was younger. PSHx tubal ligation; do not suspect concern for ectopic but will get urine preg to ensure. Concern for ovarian torsion vs  ruptured cyst vs kidney stone vs diverticulitis although without nausea or vomiting very low suspicion for diverticulitis vs other acute abdomen. There are no peritoneal signs on exam. Will get baseline bloodwork today as well as obtain pelvic ultrasound to check for torsion. If equivocal may consider CT A/P or CT renal stone study if there is blood on U/A. IVF and morphine given for symptomatic relief.   Pelvic ultrasound without findings of torsion. U/a without signs of hgb to suggest kidney stone. Pelvic exam performed; no obvious vaginal discharge appreciated or cervical friability but patient is very tearful during exam and has tenderness to bilateral adnexa and cervix with bimanual exam. She reports she is sexually active with 1 female partner. No hx of PID. If no findings on CT scan will treat empirically as PID given amount of tenderness on exam.   CT scan consistent with PID per my suspicion. IM ceftriaxone given in the ED. Will discharge home with doxycycline as well as flagyl to cover for BV. Patient advised to inform all partners that she is being treated. She is to refrain from intercourse for the next 2 weeks until she finishes her antibiotics. Advised to follow up with PCP. She is in agreement with plan at this time and stable for discharge home.          Final Clinical Impressions(s) / ED Diagnoses   Final diagnoses:  Pelvic inflammatory disease  Bacterial vaginosis  Left lower quadrant abdominal pain    ED Discharge Orders         Ordered    metroNIDAZOLE (FLAGYL) 500 MG tablet  2 times daily     09/07/18 1749    doxycycline (VIBRAMYCIN) 100 MG capsule  2 times daily     09/07/18 1749           Tanda RockersVenter, Lilliann Rossetti, PA-C 09/07/18 2222    Donnetta Hutchingook, Brian, MD 09/08/18 [phone]

## 2018-09-07 NOTE — ED Triage Notes (Signed)
Per EMS patient from home. Patient complains of lower abdominal pain. Patient states lower back pain as well. States ovarian cyst when she was 38yo. Patient states pain is similar.

## 2018-09-08 LAB — HIV ANTIBODY (ROUTINE TESTING W REFLEX): HIV Screen 4th Generation wRfx: NONREACTIVE

## 2018-09-09 LAB — RPR: RPR Ser Ql: NONREACTIVE

## 2018-09-10 LAB — GC/CHLAMYDIA PROBE AMP (~~LOC~~) NOT AT ARMC
Chlamydia: NEGATIVE
Neisseria Gonorrhea: NEGATIVE

## 2020-12-13 ENCOUNTER — Other Ambulatory Visit: Payer: Self-pay

## 2020-12-13 ENCOUNTER — Encounter (HOSPITAL_BASED_OUTPATIENT_CLINIC_OR_DEPARTMENT_OTHER): Payer: Self-pay | Admitting: Emergency Medicine

## 2020-12-13 ENCOUNTER — Emergency Department (HOSPITAL_BASED_OUTPATIENT_CLINIC_OR_DEPARTMENT_OTHER)
Admission: EM | Admit: 2020-12-13 | Discharge: 2020-12-13 | Disposition: A | Discharge status: Home / Self Care | Payer: 59 | Attending: Emergency Medicine | Admitting: Emergency Medicine

## 2020-12-13 DIAGNOSIS — Z20822 Contact with and (suspected) exposure to covid-19: Secondary | ICD-10-CM

## 2020-12-13 DIAGNOSIS — R519 Headache, unspecified: Secondary | ICD-10-CM | POA: Diagnosis present

## 2020-12-13 DIAGNOSIS — R109 Unspecified abdominal pain: Secondary | ICD-10-CM | POA: Insufficient documentation

## 2020-12-13 DIAGNOSIS — U071 COVID-19: Secondary | ICD-10-CM | POA: Diagnosis not present

## 2020-12-13 DIAGNOSIS — Z87891 Personal history of nicotine dependence: Secondary | ICD-10-CM | POA: Insufficient documentation

## 2020-12-13 LAB — RESP PANEL BY RT-PCR (FLU A&B, COVID) ARPGX2
Influenza A by PCR: NEGATIVE
Influenza B by PCR: NEGATIVE
SARS Coronavirus 2 by RT PCR: POSITIVE — AB

## 2020-12-13 NOTE — Discharge Instructions (Addendum)
If you develop high fever, severe cough or cough with blood, trouble breathing, severe headache, neck pain/stiffness, vomiting, or any other new/concerning symptoms then return to the ER for evaluation  

## 2020-12-13 NOTE — ED Notes (Signed)
Patient verbalizes understanding of discharge instructions. Opportunity for questioning and answers were provided. Patient discharged from ED.  °

## 2020-12-13 NOTE — ED Provider Notes (Signed)
[facility] EMERGENCY DEPT Provider Note   CSN: [id number] Arrival date & time: 12/13/20  8676     History Chief Complaint  Patient presents with   Covid Positive    [name] [name] is a 40 y.o. female.  HPI 40 year old female presents needing a COVID test.  She states she took an at home COVID test that was positive.  Originally her symptoms started on 10/28 with diarrhea.  She is had headache, sore throat, diarrhea and some abdominal cramping.  No fevers though she has felt hot and cold.  No shortness of breath or vomiting.  She needs to have an official PCR test for work.  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     Family History  Problem Relation Age of Onset   Hypertension Father    Cancer Paternal Aunt        BREAST   COPD Paternal Grandfather    Hyperlipidemia Paternal Grandfather    Hypertension Paternal Grandfather     Social History   Tobacco Use   Smoking status: Former    Packs/day: 1.00    Types: Cigarettes   Smokeless tobacco: Never  Substance Use Topics   Alcohol use: Yes   Drug use: Yes    Types: Marijuana, Cocaine    Home Medications Prior to Admission medications    Medication Sig Start Date End Date Taking? Authorizing Provider  hydrOXYzine (ATARAX/VISTARIL) 25 MG tablet Take 1 tablet (25 mg total) by mouth 3 (three) times daily as needed for anxiety. 08/20/17   Starkes-Perry, Juel Burrow, FNP  metroNIDAZOLE (FLAGYL) 500 MG tablet Take 1 tablet (500 mg total) by mouth 2 (two) times daily. 09/07/18   Tanda Rockers, PA-C  traZODone (DESYREL) 50 MG tablet Take 1 tablet (50 mg total) by mouth at bedtime as needed and may repeat dose one time if needed for sleep. 08/20/17   Maryagnes Amos, FNP    Allergies    Patient has no known allergies.  Review of Systems   Review of Systems  Constitutional:  Negative for fever.  HENT:  Positive for sore throat.   Respiratory:  Positive for cough.   Gastrointestinal:  Positive for diarrhea.  Neurological:  Positive for headaches.   Physical Exam Updated Vital Signs BP (!) 144/103   Pulse 83   Temp 98.3 F (36.8 C) (Oral)   Resp 16   Ht 5\' 5"  (1.651 m)   Wt 61.2 kg   SpO2 99%   BMI 22.45 kg/m   Physical Exam Vitals and nursing note reviewed.  Constitutional:      General: She is not in acute distress.    Appearance: She  is well-developed. She is not ill-appearing or diaphoretic.  HENT:     Head: Normocephalic and atraumatic.     Right Ear: External ear normal.     Left Ear: External ear normal.     Nose: Nose normal.  Eyes:     General:        Right eye: No discharge.        Left eye: No discharge.  Cardiovascular:     Rate and Rhythm: Normal rate and regular rhythm.     Heart sounds: Normal heart sounds.  Pulmonary:     Effort: Pulmonary effort is normal.     Breath sounds: Normal breath sounds.  Abdominal:     General: There is no distension.     Palpations: Abdomen is soft.     Tenderness: There is no abdominal tenderness.  Skin:    General: Skin is warm and dry.  Neurological:     Mental Status: She is alert.  Psychiatric:        Mood and Affect: Mood is not anxious.    ED  Results / Procedures / Treatments   Labs (all labs ordered are listed, but only abnormal results are displayed) Labs Reviewed  RESP PANEL BY RT-PCR (FLU A&B, COVID) [id number]    EKG None  Radiology No results found.  Procedures Procedures   Medications Ordered in ED Medications - No data to display  ED Course  I have reviewed the triage vital signs and the nursing notes.  Pertinent labs & imaging results that were available during my care of the patient were reviewed by me and considered in my medical decision making (see chart for details).    MDM Rules/Calculators/A&P                           Patient's vital signs are unremarkable besides a mild hypertension, though this improved on recheck.  Clear lungs.  She does not have any abdominal tenderness on exam.  Diarrhea has improved and so I think significant dehydration or electrolyte disturbance is pretty unlikely and do not think labs are needed.  Sound like it is probably COVID, especially with the positive antigen test.  We will test by PCR here which has already been ordered.  Otherwise she can follow this up in MyChart and appears stable for discharge home. Final Clinical Impression(s) / ED Diagnoses Final diagnoses:  Suspected COVID-19 virus infection    Rx / DC Orders ED Discharge Orders     None        Pricilla Loveless, MD 12/13/20 1014

## 2020-12-13 NOTE — ED Triage Notes (Signed)
Pt arrives to ED with c/o of at home positive COVID19 test. She reports she had diarrhea x2 days ago and headache yesterday. She took at home CVOID test yesterday which was positive. She wants a test today for work. No SOB, DOE, fevers.

## 2021-01-16 ENCOUNTER — Emergency Department (HOSPITAL_BASED_OUTPATIENT_CLINIC_OR_DEPARTMENT_OTHER)
Admission: EM | Admit: 2021-01-16 | Discharge: 2021-01-16 | Disposition: A | Discharge status: Home / Self Care | Payer: 59 | Attending: Student | Admitting: Student

## 2021-01-16 ENCOUNTER — Encounter (HOSPITAL_BASED_OUTPATIENT_CLINIC_OR_DEPARTMENT_OTHER): Payer: Self-pay | Admitting: Emergency Medicine

## 2021-01-16 ENCOUNTER — Other Ambulatory Visit: Payer: Self-pay

## 2021-01-16 DIAGNOSIS — Z87891 Personal history of nicotine dependence: Secondary | ICD-10-CM | POA: Diagnosis not present

## 2021-01-16 DIAGNOSIS — M79675 Pain in left toe(s): Secondary | ICD-10-CM | POA: Diagnosis present

## 2021-01-16 DIAGNOSIS — L600 Ingrowing nail: Secondary | ICD-10-CM | POA: Diagnosis not present

## 2021-01-16 MED ORDER — IBUPROFEN 400 MG PO TABS
600.0000 mg | ORAL_TABLET | Freq: Once | ORAL | Status: AC
  Administered 2021-01-16: 12:00:00 600 mg via ORAL
  Filled 2021-01-16: qty 1

## 2021-01-16 MED ORDER — MUPIROCIN CALCIUM 2 % EX CREA: 1.0000 "application " | TOPICAL_CREAM | Freq: Two times a day (BID) | CUTANEOUS | 0 refills | Status: AC

## 2021-01-16 MED ORDER — MUPIROCIN 2 % EX OINT: TOPICAL_OINTMENT | Freq: Once | CUTANEOUS | Status: DC

## 2021-01-16 MED ORDER — MUPIROCIN CALCIUM 2 % EX CREA: TOPICAL_CREAM | Freq: Once | CUTANEOUS | Status: DC

## 2021-01-16 MED ORDER — NAPROXEN 375 MG PO TABS: 375.0000 mg | ORAL_TABLET | Freq: Two times a day (BID) | ORAL | 0 refills | Status: AC

## 2021-01-16 NOTE — ED Notes (Signed)
RN provided AVS using Teachback Method. Patient verbalizes understanding of Discharge Instructions. Opportunity for Questioning and Answers were provided by RN. Patient Discharged from ED ambulatory to Home with family.  

## 2021-01-16 NOTE — ED Triage Notes (Signed)
Pt reports sharp throbbing distal lateral big toe pain at corner edge of toenail since last night.  Pt had pedicure 2 weeks ago and believes it she may now have an ingrown nail.  No obvious signs of infection at site observed in triage.

## 2021-01-16 NOTE — ED Provider Notes (Signed)
[facility] EMERGENCY DEPT Provider Note   CSN: [id number] Arrival date & time: 01/16/21  1057     History Chief Complaint  Patient presents with   Toe Pain    [name] [name] is a 40 y.o. female with PMH anxiety, depression, polysubstance abuse who presents to the emergency department for evaluation of left great toe pain.  Patient states that she fears she is developing a ingrown toenail.  She states that she had a pedicure 2 weeks ago and is worried about her toe pain because she has to wear steel toed boots for work.  She arrives to the emergency department requesting removal of the ingrown portion of the toenail.   Toe Pain Pertinent negatives include no chest pain, no abdominal pain and no shortness of breath.      Past Medical History:  Diagnosis Date   Anxiety    Depression    Hyperlipidemia    Substance abuse ([place])    benzos, cocaine, opiates    Patient Active Problem List   Diagnosis Date Noted   MDD (major depressive disorder), severe ([place]) 08/18/2017   Anxiety and depression 08/16/2017   Overdose, accidental or unintentional, initial encounter 08/16/2017   Encephalopathy acute 08/16/2017   QT prolongation 08/16/2017   Substance abuse ([place]) 08/16/2017   Overdose 08/16/2017   Stress and adjustment reaction 02/17/2013   SOB (shortness of breath) 02/17/2013   Panic anxiety syndrome 02/17/2013   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     Family History  Problem Relation Age of Onset   Hypertension Father    Cancer Paternal Aunt        BREAST   COPD Paternal Grandfather    Hyperlipidemia Paternal Grandfather    Hypertension Paternal Grandfather     Social History   Tobacco Use   Smoking status: Former    Packs/day: 1.00    Types: Cigarettes   Smokeless tobacco: Never   Substance Use Topics   Alcohol use: Yes   Drug use: Yes    Types: Marijuana, Cocaine    Home Medications Prior to Admission medications   Medication Sig Start Date End Date Taking? Authorizing Provider  mupirocin cream (BACTROBAN) 2 % Apply 1 application topically 2 (two) times daily. 01/16/21  Yes Hydeia Mcatee, MD  naproxen (NAPROSYN) 375 MG tablet Take 1 tablet (375 mg total) by mouth 2 (two) times daily. 01/16/21  Yes Aayla Marrocco, MD  hydrOXYzine (ATARAX/VISTARIL) 25 MG tablet Take 1 tablet (25 mg total) by mouth 3 (three) times daily as needed for anxiety. 08/20/17   Starkes-Perry, Gayland Curry, FNP  metroNIDAZOLE (FLAGYL) 500 MG tablet Take 1 tablet (500 mg total) by mouth 2 (two) times daily. 09/07/18   Eustaquio Maize, PA-C  traZODone (DESYREL) 50 MG tablet Take 1 tablet (50 mg total) by mouth at bedtime as needed and may repeat dose one time if needed for sleep. 08/20/17   Suella Broad, FNP    Allergies    Patient has no known allergies.  Review of Systems   Review of Systems  Constitutional:  Negative for chills and fever.  HENT:  Negative for ear pain and sore throat.   Eyes:  Negative for pain and visual disturbance.  Respiratory:  Negative for cough and shortness of breath.  Cardiovascular:  Negative for chest pain and palpitations.  Gastrointestinal:  Negative for abdominal pain and vomiting.  Genitourinary:  Negative for dysuria and hematuria.  Musculoskeletal:  Negative for arthralgias and back pain.       Toe pain  Skin:  Negative for color change and rash.  Neurological:  Negative for seizures and syncope.  All other systems reviewed and are negative.  Physical Exam Updated Vital Signs BP 104/80 (BP Location: Right Arm)   Pulse 78   Temp 98 F (36.7 C)   Resp 18   Ht 5\' 7"  (1.702 m)   Wt 97.5 kg   LMP 01/02/2021 (Approximate)   SpO2 99%   BMI 33.67 kg/m   Physical Exam Vitals and nursing note reviewed.  Constitutional:      General: She is  not in acute distress.    Appearance: She is well-developed.  HENT:     Head: Normocephalic and atraumatic.  Eyes:     Conjunctiva/sclera: Conjunctivae normal.  Cardiovascular:     Rate and Rhythm: Normal rate and regular rhythm.     Heart sounds: No murmur heard. Pulmonary:     Effort: Pulmonary effort is normal. No respiratory distress.     Breath sounds: Normal breath sounds.  Abdominal:     Palpations: Abdomen is soft.     Tenderness: There is no abdominal tenderness.  Musculoskeletal:        General: Tenderness (left great toe) present. No swelling.     Cervical back: Neck supple.  Skin:    General: Skin is warm and dry.     Capillary Refill: Capillary refill takes less than 2 seconds.  Neurological:     Mental Status: She is alert.  Psychiatric:        Mood and Affect: Mood normal.    ED Results / Procedures / Treatments   Labs (all labs ordered are listed, but only abnormal results are displayed) Labs Reviewed - No data to display  EKG None  Radiology No results found.  Procedures Procedures   Medications Ordered in ED Medications  mupirocin ointment (BACTROBAN) 2 % (has no administration in time range)  ibuprofen (ADVIL) tablet 600 mg (600 mg Oral Given 01/16/21 1158)    ED Course  I have reviewed the triage vital signs and the nursing notes.  Pertinent labs & imaging results that were available during my care of the patient were reviewed by me and considered in my medical decision making (see chart for details).    MDM Rules/Calculators/A&P                           Patient seen emergency department for evaluation of toe pain.  Physical exam reveals very mild tenderness to the left great toe.  No erythema or obvious paronychia.  Patient advised to soak her feet while watching television in soapy water and follow-up with her podiatrist if she would like fixation of the toenail.  There is no obvious ingrown portion of the nail here in the emergency  department and she is ambulatory without difficulty.  Patient then discharged with a prescription for Naprosyn and Bactroban. Final Clinical Impression(s) / ED Diagnoses Final diagnoses:  Ingrown toenail    Rx / DC Orders ED Discharge Orders          Ordered    naproxen (NAPROSYN) 375 MG tablet  2 times daily        01/16/21 1145  mupirocin cream (BACTROBAN) 2 %  2 times daily        01/16/21 1154             Gracy Ehly, [place], MD 01/16/21 1505

## 2021-06-13 ENCOUNTER — Other Ambulatory Visit: Payer: Self-pay

## 2021-06-13 ENCOUNTER — Emergency Department (HOSPITAL_BASED_OUTPATIENT_CLINIC_OR_DEPARTMENT_OTHER)
Admission: EM | Admit: 2021-06-13 | Discharge: 2021-06-13 | Disposition: A | Discharge status: Home / Self Care | Payer: 59 | Attending: Emergency Medicine | Admitting: Emergency Medicine

## 2021-06-13 ENCOUNTER — Encounter (HOSPITAL_BASED_OUTPATIENT_CLINIC_OR_DEPARTMENT_OTHER): Payer: Self-pay | Admitting: Obstetrics and Gynecology

## 2021-06-13 DIAGNOSIS — R350 Frequency of micturition: Secondary | ICD-10-CM | POA: Insufficient documentation

## 2021-06-13 DIAGNOSIS — M545 Low back pain, unspecified: Secondary | ICD-10-CM | POA: Insufficient documentation

## 2021-06-13 DIAGNOSIS — N920 Excessive and frequent menstruation with regular cycle: Secondary | ICD-10-CM | POA: Insufficient documentation

## 2021-06-13 HISTORY — DX: Type 2 diabetes mellitus without complications: E11.9

## 2021-06-13 LAB — URINALYSIS, ROUTINE W REFLEX MICROSCOPIC
Bilirubin Urine: NEGATIVE
Glucose, UA: NEGATIVE mg/dL
Hgb urine dipstick: NEGATIVE
Ketones, ur: NEGATIVE mg/dL
Leukocytes,Ua: NEGATIVE
Nitrite: NEGATIVE
Protein, ur: NEGATIVE mg/dL
Specific Gravity, Urine: 1.011 (ref 1.005–1.030)
pH: 6 (ref 5.0–8.0)

## 2021-06-13 LAB — CBC
HCT: 30.2 % — ABNORMAL LOW (ref 36.0–46.0)
Hemoglobin: 9.5 g/dL — ABNORMAL LOW (ref 12.0–15.0)
MCH: 23.6 pg — ABNORMAL LOW (ref 26.0–34.0)
MCHC: 31.5 g/dL (ref 30.0–36.0)
MCV: 74.9 fL — ABNORMAL LOW (ref 80.0–100.0)
Platelets: 344 10*3/uL (ref 150–400)
RBC: 4.03 MIL/uL (ref 3.87–5.11)
RDW: 15.4 % (ref 11.5–15.5)
WBC: 8.5 10*3/uL (ref 4.0–10.5)
nRBC: 0 % (ref 0.0–0.2)

## 2021-06-13 LAB — BASIC METABOLIC PANEL
Anion gap: 10 (ref 5–15)
BUN: 14 mg/dL (ref 6–20)
CO2: 25 mmol/L (ref 22–32)
Calcium: 9.9 mg/dL (ref 8.9–10.3)
Chloride: 103 mmol/L (ref 98–111)
Creatinine, Ser: 0.75 mg/dL (ref 0.44–1.00)
GFR, Estimated: >60 mL/min (ref >60)
Glucose, Bld: 92 mg/dL (ref 70–99)
Potassium: 3.7 mmol/L (ref 3.5–5.1)
Sodium: 138 mmol/L (ref 135–145)

## 2021-06-13 LAB — PREGNANCY, URINE: Preg Test, Ur: NEGATIVE

## 2021-06-13 NOTE — ED Provider Notes (Signed)
?[facility] EMER[facility]PT ?Provider Note ? ? ?CSN: [id number] ?Arrival date & time: 06/13/21  0951 ? ?  ? ?History ? ?Chief Complaint  ?Patient presents with  ? Urinary Frequency  ? ? ?[name] [name] is a 41 y.o. female.  Patient presents to the emergency department complaining of urinary frequency.  Endorses frequent visits to the restroom, endorses stress in urgent type incontinence.  Patient also endorses chronic low back pain.  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  States that she will have leakage after sneezing.  Endorses heavy periods.  Patient states that her tubes were tied 17 years ago.  Denies headache, shortness of breath, dysuria, diarrhea, chest pain.  Patient was last sexually active approximately 2 years ago.  Past medical history significant for possible diabetes mellitus, anxiety, depression, hyperlipidemia, substance abuse, tubal ligation. ? ?HPI ? ?  ? ?Home Medications ?Prior to Admission medications   ?Medication Sig Start Date End Date Taking? Authorizing Provider  ?hydrOXYzine (ATARAX/VISTARIL) 25 MG tablet Take 1 tablet (25 mg total) by mouth 3 (three) times daily as needed for anxiety. 08/20/17   Maryagnes Amos, FNP  ?metroNIDAZOLE (FLAGYL) 500 MG tablet Take 1 tablet (500 mg total) by mouth 2 (two) times daily. 09/07/18   Tanda Rockers, PA-C  ?mupirocin cream (BACTROBAN) 2 % Apply 1 application topically 2 (two) times daily. 01/16/21   Kommor, Madison, MD  ?naproxen (NAPROSYN) 375 MG tablet Take 1 tablet (375 mg total) by mouth 2 (two) times daily. 01/16/21   Kommor, Wyn Forster, MD  ?traZODone (DESYREL) 50 MG tablet Take 1 tablet (50 mg total) by mouth at bedtime as needed and may repeat dose one time if needed for sleep. 08/20/17   Maryagnes Amos, FNP  ?   ? ?Allergies    ?Patient has no known allergies.   ? ?Review of  Systems   ?Review of Systems  ?Constitutional:  Negative for chills, fever and unexpected weight change.  ?Respiratory:  Negative for shortness of breath.   ?Cardiovascular:  Negative for chest pain.  ?Gastrointestinal:  Negative for abdominal pain.  ?Endocrine: Positive for polydipsia, polyphagia and polyuria.  ?Genitourinary:  Positive for frequency and urgency. Negative for flank pain.  ?Musculoskeletal:  Positive for back pain.  ? ?Physical Exam ?Updated Vital Signs ?BP 124/85   Pulse 80   Temp 98.4 ?F (36.9 ?C) (Oral)   Resp 18   LMP 06/08/2021 (Exact Date)   SpO2 100%  ?Physical Exam ?Vitals and nursing note reviewed.  ?Constitutional:   ?   General: She is not in acute distress. ?HENT:  ?   Head: Normocephalic and atraumatic.  ?Eyes:  ?   Conjunctiva/sclera: Conjunctivae normal.  ?Cardiovascular:  ?   Rate and Rhythm: Normal rate and regular rhythm.  ?   Pulses: Normal pulses.  ?   Heart sounds: Normal heart sounds.  ?Pulmonary:  ?   Effort: Pulmonary effort is normal.  ?   Breath sounds: Normal breath sounds.  ?Abdominal:  ?   Palpations: Abdomen is soft.  ?   Tenderness: There is no abdominal tenderness. There is no right CVA tenderness or left CVA tenderness.  ?Genitourinary: ?   Comments: Deferred ?Musculoskeletal:     ?   General: Tenderness (Bilateral tenderness lumbar region, no midline tenderness) present.  ?   Cervical back: Normal  range of motion.  ?Skin: ?   General: Skin is warm and dry.  ?Neurological:  ?   Mental Status: She is alert and oriented to person, place, and time.  ? ? ?ED Results / Procedures / Treatments   ?Labs ?(all labs ordered are listed, but only abnormal results are displayed) ?Labs Reviewed  ?CBC - Abnormal; Notable for the following components:  ?    Result Value  ? Hemoglobin 9.5 (*)   ? HCT 30.2 (*)   ? MCV 74.9 (*)   ? MCH 23.6 (*)   ? All other components within normal limits  ?URINALYSIS, ROUTINE W REFLEX MICROSCOPIC  ?PREGNANCY, URINE  ?BASIC METABOLIC PANEL   ? ? ?EKG ?None ? ?Radiology ?No results found. ? ?Procedures ?Procedures  ? ?Medications Ordered in ED ?Medications - No data to display ? ?ED Course/ Medical Decision Making/ A&P ?  ?                        ?Medical Decision Making ?Amount and/or Complexity of Data Reviewed ?Labs: ordered. ? ? ?The patient presents with a chief complaint of urinary frequency.  Differential includes UTI, medication toxicity, vaginal atrophy, PID, and others. ? ?Patient has not been sexually active in 2 years.  PID very low on differential and very unlikely. ? ?No current medications to think of medication toxicity. ? ?I ordered and interpreted labs.  Pertinent results include urinalysis: Grossly negative; BMP: Grossly normal; CBC: Hemoglobin 9.5, HCT 30.2, MCV 74.9, MCH 23.6 ? ?No signs of urinary tract infection on urinalysis.  No signs of infection and CBC. ? ?There is no indication for further evaluation in the emergent setting for her frequency at this time.  Recommend follow-up with primary care for further evaluation and management.  The patient does have anemia.  When compared to available lab values over the past 3 years the patient's hemoglobin has actually improved slightly.  The patient does endorse heavy menstrual cycles.  Recommend follow-up with primary care to discuss possible treatment for those.  Possibly blood loss anemia versus iron deficiency anemia.  Recommend discharge home at this time. ? ?Final Clinical Impression(s) / ED Diagnoses ?Final diagnoses:  ?Urinary frequency  ?Menorrhagia with regular cycle  ? ? ?Rx / DC Orders ?ED Discharge Orders   ? ? None  ? ?  ? ? ?  ?Darrick Grinder, PA-C ?06/13/21 1226 ? ?  ?Cheryll Cockayne, MD ?06/13/21 1236 ? ?

## 2021-06-13 NOTE — Discharge Instructions (Addendum)
You were seen today for urinary frequency.  There was no sign of infection on your urinalysis or any blood work.  Recommend follow-up with primary care.  You do have anemia.  Your hemoglobin has been low over the past 3 years of visits at the emergency department based on available labs.  I recommend discussing your heavy periods with your primary care provider for further evaluation and management. ?

## 2021-06-13 NOTE — ED Triage Notes (Signed)
Patient reports pelvic pressure, urinary frequency, stress and urge incontinence x3 days. Patient reports she had her period not long ago and she has been spotting as well. Patient reports she feels like her bladder is 1000lbs.   ?

## 2022-01-22 ENCOUNTER — Encounter (HOSPITAL_BASED_OUTPATIENT_CLINIC_OR_DEPARTMENT_OTHER): Payer: Self-pay | Admitting: Emergency Medicine

## 2022-01-22 ENCOUNTER — Other Ambulatory Visit: Payer: Self-pay

## 2022-01-22 ENCOUNTER — Emergency Department (HOSPITAL_BASED_OUTPATIENT_CLINIC_OR_DEPARTMENT_OTHER)
Admission: EM | Admit: 2022-01-22 | Discharge: 2022-01-22 | Disposition: A | Discharge status: Home / Self Care | Payer: 59 | Attending: Emergency Medicine | Admitting: Emergency Medicine

## 2022-01-22 DIAGNOSIS — U071 COVID-19: Secondary | ICD-10-CM | POA: Insufficient documentation

## 2022-01-22 DIAGNOSIS — R059 Cough, unspecified: Secondary | ICD-10-CM | POA: Diagnosis present

## 2022-01-22 LAB — RESP PANEL BY RT-PCR (RSV, FLU A&B, COVID)  RVPGX2
Influenza A by PCR: NEGATIVE
Influenza B by PCR: NEGATIVE
Resp Syncytial Virus by PCR: NEGATIVE
SARS Coronavirus 2 by RT PCR: POSITIVE — AB

## 2022-01-22 MED ORDER — NIRMATRELVIR/RITONAVIR (PAXLOVID)TABLET: 3.0000 | ORAL_TABLET | Freq: Two times a day (BID) | ORAL | 0 refills | Status: AC

## 2022-01-22 MED ORDER — BENZONATATE 100 MG PO CAPS: 100.0000 mg | ORAL_CAPSULE | Freq: Three times a day (TID) | ORAL | 0 refills | Status: AC

## 2022-01-22 NOTE — ED Notes (Signed)
ED Provider at bedside. 

## 2022-01-22 NOTE — ED Notes (Signed)
Discharge instructions discussed with pt. Pt verbalized understanding. Pt stable and ambulatory.  °

## 2022-01-22 NOTE — ED Notes (Signed)
Pt ambulated in room independently w/o difficultly or SOB. Pt O2 sat 97% or greater. Pulse stayed in the 90's

## 2022-01-22 NOTE — ED Triage Notes (Addendum)
Pt presents to ED POV. Pt c/o cough, congestion, body aches since Thursday. Pt reports that she was wheezing last night. Resp e/u in triage

## 2022-01-22 NOTE — ED Provider Notes (Signed)
[facility] EMERGENCY DEPT Provider Note   CSN: [id number] Arrival date & time: 01/22/22  1227     History  Chief Complaint  Patient presents with   Cough    [name] [name] is a 41 y.o. female.   Cough Associated symptoms: shortness of breath      41 year old female with medical history significant for anxiety and depression who presents to the emerged department with cough, generalized bodyaches and congestion since this past Thursday.  Subjective fevers at home.  She endorses mild wheezing at home.  Home Medications Prior to Admission medications   Medication Sig Start Date End Date Taking? Authorizing Provider  benzonatate (TESSALON) 100 MG capsule Take 1 capsule (100 mg total) by mouth every 8 (eight) hours. 01/22/22  Yes Ernie Avena, MD  hydrOXYzine (ATARAX/VISTARIL) 25 MG tablet Take 1 tablet (25 mg total) by mouth 3 (three) times daily as needed for anxiety. 08/20/17  Yes Starkes-Perry, Juel Burrow, FNP  metroNIDAZOLE (FLAGYL) 500 MG tablet Take 1 tablet (500 mg total) by mouth 2 (two) times daily. 09/07/18  Yes Venter, Margaux, PA-C  mupirocin cream (BACTROBAN) 2 % Apply 1 application topically 2 (two) times daily. 01/16/21  Yes Kommor, Madison, MD  naproxen (NAPROSYN) 375 MG tablet Take 1 tablet (375 mg total) by mouth 2 (two) times daily. 01/16/21  Yes Kommor, Madison, MD  nirmatrelvir/ritonavir EUA (PAXLOVID) 20 x 150 MG & 10 x 100MG  TABS Take 3 tablets by mouth 2 (two) times daily for 5 days. Take nirmatrelvir (150 mg) two tablets twice daily for 5 days and ritonavir (100 mg) one tablet twice daily for 5 days. 01/22/22 01/27/22 Yes 01/29/22, MD  traZODone (DESYREL) 50 MG tablet Take 1 tablet (50 mg total) by mouth at bedtime as needed and may repeat dose one time if needed for sleep. 08/20/17   10/21/17, FNP      Allergies    Patient has no known allergies.    Review of Systems   Review of Systems  Respiratory:  Positive for cough and  shortness of breath.   All other systems reviewed and are negative.   Physical Exam Updated Vital Signs BP (!) 121/91 (BP Location: Right Arm)   Pulse 75   Temp 98.7 F (37.1 C) (Oral)   Resp 18   SpO2 99%  Physical Exam Vitals and nursing note reviewed.  Constitutional:      General: She is not in acute distress. HENT:     Head: Normocephalic and atraumatic.  Eyes:     Conjunctiva/sclera: Conjunctivae normal.     Pupils: Pupils are equal, round, and reactive to light.  Cardiovascular:     Rate and Rhythm: Normal rate and regular rhythm.     Heart sounds: Normal heart sounds.  Pulmonary:     Effort: Pulmonary effort is normal. No respiratory distress.     Breath sounds: Normal breath sounds.  Abdominal:     General: There is no distension.     Tenderness: There is no guarding.  Musculoskeletal:        General: No deformity or signs of injury.     Cervical back: Neck supple.  Skin:    Findings: No lesion or rash.  Neurological:     General: No focal deficit present.     Mental Status: She is alert. Mental status is at baseline.     ED Results / Procedures / Treatments   Labs (all labs ordered are listed, but only abnormal results  are displayed) Labs Reviewed  RESP PANEL BY RT-PCR (RSV, FLU A&B, COVID)  [id number] - Abnormal; Notable for the following components:      Result Value   SARS Coronavirus 2 by RT PCR POSITIVE (*)    All other components within normal limits    EKG None  Radiology No results found.  Procedures Procedures    Medications Ordered in ED Medications - No data to display  ED Course/ Medical Decision Making/ A&P Clinical Course as of 01/22/22 1520  Sun Jan 22, 2022  1511 SARS Coronavirus 2 by RT PCR(!): POSITIVE [JL]    Clinical Course User Index [JL] Ernie Avena, MD                           Medical Decision Making Amount and/or Complexity of Data Reviewed Labs:  Decision-making details documented in ED Course.      41 year old female with medical history significant for anxiety and depression who presents to the emerged department with cough, generalized bodyaches and congestion since this past Thursday.  Subjective fevers at home.  She endorses mild wheezing at home.  On arrival, the patient was vitally stable, not hypoxic, not tachycardic or tachypneic, afebrile, hemodynamically stable.  The patient tested positive for COVID-19 via PCR testing.  Ambulatory pulse oximetry was performed with no desaturations noted.  The patient is overall well-appearing with a reassuring physical exam.  Discussed symptomatic management to include Tylenol and ibuprofen.  Discussed treatment with Paxlovid.  Will prescribe a short course.  Return precautions provided.  Overall stable for discharge with outpatient management.  Final Clinical Impression(s) / ED Diagnoses Final diagnoses:  COVID-19    Rx / DC Orders ED Discharge Orders          Ordered    benzonatate (TESSALON) 100 MG capsule  Every 8 hours        01/22/22 1520    nirmatrelvir/ritonavir EUA (PAXLOVID) 20 x 150 MG & 10 x 100MG  TABS  2 times daily        01/22/22 1520              14/10/23, MD 01/22/22 1520

## 2022-10-18 ENCOUNTER — Encounter (HOSPITAL_BASED_OUTPATIENT_CLINIC_OR_DEPARTMENT_OTHER): Payer: Self-pay

## 2022-10-18 ENCOUNTER — Emergency Department (HOSPITAL_BASED_OUTPATIENT_CLINIC_OR_DEPARTMENT_OTHER): Payer: Medicaid Other | Admitting: Radiology

## 2022-10-18 ENCOUNTER — Other Ambulatory Visit: Payer: Self-pay

## 2022-10-18 ENCOUNTER — Emergency Department (HOSPITAL_BASED_OUTPATIENT_CLINIC_OR_DEPARTMENT_OTHER)
Admission: EM | Admit: 2022-10-18 | Discharge: 2022-10-18 | Disposition: A | Discharge status: Home / Self Care | Payer: Medicaid Other | Attending: Emergency Medicine | Admitting: Emergency Medicine

## 2022-10-18 DIAGNOSIS — X58XXXA Exposure to other specified factors, initial encounter: Secondary | ICD-10-CM | POA: Insufficient documentation

## 2022-10-18 DIAGNOSIS — S46912A Strain of unspecified muscle, fascia and tendon at shoulder and upper arm level, left arm, initial encounter: Secondary | ICD-10-CM

## 2022-10-18 DIAGNOSIS — R030 Elevated blood-pressure reading, without diagnosis of hypertension: Secondary | ICD-10-CM

## 2022-10-18 DIAGNOSIS — M25512 Pain in left shoulder: Secondary | ICD-10-CM | POA: Insufficient documentation

## 2022-10-18 MED ORDER — IBUPROFEN 400 MG PO TABS
400.0000 mg | ORAL_TABLET | Freq: Once | ORAL | Status: AC
  Administered 2022-10-18: 400 mg via ORAL
  Filled 2022-10-18: qty 1

## 2022-10-18 MED ORDER — OXYCODONE-ACETAMINOPHEN 5-325 MG PO TABS
1.0000 | ORAL_TABLET | ORAL | Status: DC | PRN
  Administered 2022-10-18: 1 via ORAL
  Filled 2022-10-18: qty 1

## 2022-10-18 MED ORDER — TRAMADOL HCL 50 MG PO TABS: 50.0000 mg | ORAL_TABLET | Freq: Four times a day (QID) | ORAL | 0 refills | Status: AC | PRN

## 2022-10-18 NOTE — ED Triage Notes (Signed)
Pt states she woke up with left arm pain, thought she slept on it wrong, pain up to left shoulder, limited ROM.

## 2022-10-18 NOTE — Discharge Instructions (Addendum)
It was our pleasure to provide your ER care today - we hope that you feel better.  Take acetaminophen or ibuprofen as need. You may also take ultram as need for pain - no driving for the next 6 hours or when taking ultram.  Wear shoulder immobilizer as need for comfort/support for the next few days. Icepack to sore area.  Follow up with orthopedist in one week - call office to arrange appointment.   Your blood pressure is high tonight - follow up with primary care doctor in the next few weeks.  Return to ER if worse, new symptoms, new/severe pain, numbness/weakness, or other concern.

## 2022-10-18 NOTE — ED Provider Notes (Signed)
[place] [facility] AT [facility] Provider Note   CSN: [id number] Arrival date & time: 10/18/22  2026     History  Chief Complaint  Patient presents with   Arm Pain    [name] [name] is a 42 y.o. female.  Pt c/o left shoulder pain. Symptoms acute onset this AM, felt fine last night, but awoke with shoulder pain, worse w active rom. Unsure if possibly slept on awkwardly. Denies trauma or fall. Did use arms/shoulder quite a bit yesterday, but denies specific strain or overuse. No fever or chills. No hx same symptoms. Pain localized to shoulder, worse w movement. No neck or radicular pain. No numbness/weakness. Denies other pain or injury. No elbow pain or wrist pain. No LUE swelling.   The history is provided by the patient, medical records and a relative.  Arm Pain Pertinent negatives include no chest pain and no shortness of breath.       Home Medications Prior to Admission medications   Medication Sig Start Date End Date Taking? Authorizing Provider  benzonatate (TESSALON) 100 MG capsule Take 1 capsule (100 mg total) by mouth every 8 (eight) hours. 01/22/22   Ernie Avena, MD  hydrOXYzine (ATARAX/VISTARIL) 25 MG tablet Take 1 tablet (25 mg total) by mouth 3 (three) times daily as needed for anxiety. 08/20/17   Starkes-Perry, Juel Burrow, FNP  metroNIDAZOLE (FLAGYL) 500 MG tablet Take 1 tablet (500 mg total) by mouth 2 (two) times daily. 09/07/18   Tanda Rockers, PA-C  mupirocin cream (BACTROBAN) 2 % Apply 1 application topically 2 (two) times daily. 01/16/21   Kommor, Madison, MD  naproxen (NAPROSYN) 375 MG tablet Take 1 tablet (375 mg total) by mouth 2 (two) times daily. 01/16/21   Kommor, Wyn Forster, MD  traZODone (DESYREL) 50 MG tablet Take 1 tablet (50 mg total) by mouth at bedtime as needed and may repeat dose one time if needed for sleep. 08/20/17   Maryagnes Amos, FNP      Allergies    Patient has no known allergies.    Review of Systems   Review of  Systems  Constitutional:  Negative for chills and fever.  Respiratory:  Negative for shortness of breath.   Cardiovascular:  Negative for chest pain.  Musculoskeletal:  Negative for neck pain.  Skin:  Negative for rash and wound.  Neurological:  Negative for weakness and numbness.    Physical Exam Updated Vital Signs BP (!) 136/99   Pulse (!) 107   Temp 97.6 F (36.4 C) (Oral)   Resp 18   Ht 1.702 m (5\' 7" )   Wt 92.1 kg   LMP 10/02/2022   SpO2 98%   BMI 31.79 kg/m  Physical Exam Vitals and nursing note reviewed.  Constitutional:      Appearance: Normal appearance. She is well-developed.  HENT:     Head: Atraumatic.     Nose: Nose normal.     Mouth/Throat:     Mouth: Mucous membranes are moist.  Eyes:     General: No scleral icterus.    Conjunctiva/sclera: Conjunctivae normal.  Neck:     Trachea: No tracheal deviation.  Cardiovascular:     Rate and Rhythm: Normal rate.     Pulses: Normal pulses.  Pulmonary:     Effort: Pulmonary effort is normal. No respiratory distress.  Musculoskeletal:        General: No swelling.     Cervical back: Normal range of motion and neck supple. No rigidity or tenderness.  No muscular tenderness.     Comments: Limited active rom due to pain. Good, slow passive rom. No erythema or increased warmth. No edema to shoulder or LUE. No pain w rom at wrist or elbow. Radial pulse 2+. LUE and shoulder of normal color and warmth.  C spine non tender, aligned, normal rom.   Skin:    General: Skin is warm and dry.     Findings: No rash.  Neurological:     Mental Status: She is alert.     Comments: Alert, speech normal. LUE and [place] nvi w intact motor fxn, stre 5/5. Sens intact. Rad/med/uln n fxn intact.   Psychiatric:        Mood and Affect: Mood normal.     ED Results / Procedures / Treatments   Labs (all labs ordered are listed, but only abnormal results are displayed) Labs Reviewed - No data to display  EKG None  Radiology DG Shoulder  Left  Result Date: 10/18/2022 CLINICAL DATA:  Patient woke up today with left shoulder and arm pain. No known injury. Limited range of motion due to pain. EXAM: LEFT SHOULDER - 2+ VIEW; LEFT HUMERUS - 2+ VIEW COMPARISON:  None Available. FINDINGS: Three views of the left shoulder and four views of the left humerus are obtained. No evidence of acute fracture or dislocation. No focal bone lesion or bone destruction. Bone cortex appears intact. Coracoclavicular and acromioclavicular spaces are normal. Visualized portions of the left ribs appear intact. Soft tissues are unremarkable. IMPRESSION: No acute bony abnormalities demonstrated in the left shoulder or left humerus. Electronically Signed   By: Burman Nieves M.D.   On: 10/18/2022 21:09   DG Humerus Left  Result Date: 10/18/2022 CLINICAL DATA:  Patient woke up today with left shoulder and arm pain. No known injury. Limited range of motion due to pain. EXAM: LEFT SHOULDER - 2+ VIEW; LEFT HUMERUS - 2+ VIEW COMPARISON:  None Available. FINDINGS: Three views of the left shoulder and four views of the left humerus are obtained. No evidence of acute fracture or dislocation. No focal bone lesion or bone destruction. Bone cortex appears intact. Coracoclavicular and acromioclavicular spaces are normal. Visualized portions of the left ribs appear intact. Soft tissues are unremarkable. IMPRESSION: No acute bony abnormalities demonstrated in the left shoulder or left humerus. Electronically Signed   By: Burman Nieves M.D.   On: 10/18/2022 21:09    Procedures Procedures    Medications Ordered in ED Medications  oxyCODONE-acetaminophen (PERCOCET/ROXICET) 5-325 MG per tablet 1 tablet (1 tablet Oral Given 10/18/22 2039)  ibuprofen (ADVIL) tablet 400 mg (400 mg Oral Given 10/18/22 2248)    ED Course/ Medical Decision Making/ A&P                                 Medical Decision Making Problems Addressed: Acute pain of left shoulder: acute illness or  injury Elevated blood pressure reading: acute illness or injury Strain of left shoulder, initial encounter: acute illness or injury  Amount and/or Complexity of Data Reviewed Independent Historian:     Details: Fam, hx External Data Reviewed: notes. Radiology: ordered and independent interpretation performed. Decision-making details documented in ED Course.  Risk OTC drugs. Prescription drug management.   Xrays.   Ibuprofen po. Percocet po.   Reviewed nursing notes and prior charts for additional history.   Xrays reviewed/interpreted by me - no fx.   Shoulder immobilizer.  Rec ortho f/u.  Return precautions provided.            Final Clinical Impression(s) / ED Diagnoses Final diagnoses:  None    Rx / DC Orders ED Discharge Orders     None         Cathren Laine, MD 10/18/22 2309

## 2022-10-18 NOTE — ED Notes (Signed)
Pt verbalized understanding of d/c instructions, meds, and followup care. Denies questions. VSS, no distress noted. Steady gait to exit with all belongings.  ?
